# Patient Record
Sex: Male | Born: 1945 | Race: Black or African American | Hispanic: No | Marital: Married | Smoking: Former smoker
Health system: Southern US, Community
[De-identification: ages and names within clinical notes are randomized; demographics above are authoritative.]

## PROBLEM LIST (undated history)

## (undated) DIAGNOSIS — R7303 Prediabetes: Secondary | ICD-10-CM

## (undated) DIAGNOSIS — K635 Polyp of colon: Secondary | ICD-10-CM

## (undated) DIAGNOSIS — D649 Anemia, unspecified: Secondary | ICD-10-CM

## (undated) DIAGNOSIS — K621 Rectal polyp: Secondary | ICD-10-CM

## (undated) DIAGNOSIS — K219 Gastro-esophageal reflux disease without esophagitis: Secondary | ICD-10-CM

## (undated) DIAGNOSIS — I10 Essential (primary) hypertension: Secondary | ICD-10-CM

## (undated) HISTORY — PX: CHOLECYSTECTOMY: SHX55

---

## 1999-05-17 ENCOUNTER — Encounter (INDEPENDENT_AMBULATORY_CARE_PROVIDER_SITE_OTHER): Payer: Self-pay

## 1999-05-17 ENCOUNTER — Ambulatory Visit (HOSPITAL_COMMUNITY): Admission: RE | Admit: 1999-05-17 | Discharge: 1999-05-17 | Payer: Self-pay | Admitting: Gastroenterology

## 2001-03-07 ENCOUNTER — Encounter: Admission: RE | Admit: 2001-03-07 | Discharge: 2001-03-07 | Payer: Self-pay | Admitting: *Deleted

## 2001-03-07 ENCOUNTER — Encounter: Payer: Self-pay | Admitting: *Deleted

## 2001-07-27 ENCOUNTER — Encounter: Admission: RE | Admit: 2001-07-27 | Discharge: 2001-07-27 | Payer: Self-pay | Admitting: Family Medicine

## 2001-07-27 ENCOUNTER — Encounter: Payer: Self-pay | Admitting: Family Medicine

## 2001-08-02 ENCOUNTER — Encounter: Payer: Self-pay | Admitting: Family Medicine

## 2001-08-02 ENCOUNTER — Encounter: Admission: RE | Admit: 2001-08-02 | Discharge: 2001-08-02 | Payer: Self-pay | Admitting: Family Medicine

## 2001-10-23 ENCOUNTER — Ambulatory Visit (HOSPITAL_COMMUNITY): Admission: RE | Admit: 2001-10-23 | Discharge: 2001-10-23 | Payer: Self-pay | Admitting: Gastroenterology

## 2002-06-24 ENCOUNTER — Encounter: Admission: RE | Admit: 2002-06-24 | Discharge: 2002-06-24 | Payer: Self-pay | Admitting: Gastroenterology

## 2002-06-24 ENCOUNTER — Encounter: Payer: Self-pay | Admitting: Gastroenterology

## 2002-06-26 ENCOUNTER — Encounter: Payer: Self-pay | Admitting: Gastroenterology

## 2002-06-26 ENCOUNTER — Encounter: Admission: RE | Admit: 2002-06-26 | Discharge: 2002-06-26 | Payer: Self-pay | Admitting: Gastroenterology

## 2002-07-29 ENCOUNTER — Encounter: Payer: Self-pay | Admitting: General Surgery

## 2002-08-08 ENCOUNTER — Encounter: Payer: Self-pay | Admitting: General Surgery

## 2002-08-08 ENCOUNTER — Observation Stay (HOSPITAL_COMMUNITY): Admission: RE | Admit: 2002-08-08 | Discharge: 2002-08-09 | Payer: Self-pay | Admitting: General Surgery

## 2002-08-08 ENCOUNTER — Encounter (INDEPENDENT_AMBULATORY_CARE_PROVIDER_SITE_OTHER): Payer: Self-pay | Admitting: Specialist

## 2004-08-09 ENCOUNTER — Encounter: Admission: RE | Admit: 2004-08-09 | Discharge: 2004-08-09 | Payer: Self-pay | Admitting: Family Medicine

## 2006-07-26 ENCOUNTER — Encounter: Admission: RE | Admit: 2006-07-26 | Discharge: 2006-07-26 | Payer: Self-pay | Admitting: Family Medicine

## 2007-04-30 ENCOUNTER — Observation Stay (HOSPITAL_COMMUNITY): Admission: EM | Admit: 2007-04-30 | Discharge: 2007-04-30 | Payer: Self-pay | Admitting: Emergency Medicine

## 2008-06-18 ENCOUNTER — Emergency Department (HOSPITAL_COMMUNITY): Admission: EM | Admit: 2008-06-18 | Discharge: 2008-06-18 | Payer: Self-pay | Admitting: Family Medicine

## 2011-02-15 NOTE — H&P (Signed)
NAMEQADIR, FOLKS NO.:  [id number]   MEDICAL RECORD NO.:  [id number]          PATIENT TYPE:  INP   LOCATION:  4733                         FACILITY:  [facility]   PHYSICIAN:  Della Goo, M.D. DATE OF BIRTH:  1946/02/06   DATE OF ADMISSION:  04/30/2007  DATE OF DISCHARGE:                              HISTORY & PHYSICAL   PRIMARY CARE PHYSICIAN:  Dr. Rise Mu Mazzocchi   CHIEF COMPLAINT:  Increasing swelling of lips.   HISTORY OF PRESENT ILLNESS:  This is a 65 year old male presenting to  the emergency department secondary to complaints of worsening lip  swelling.  He reports the symptoms began at approximately 3 p.m.  He  denies having any difficulty breathing or tongue swelling.  The patient  has a history of hypertension and is on lisinopril therapy.  He denies  having any chest pain, nausea, vomiting, diarrhea, lightheadedness,  syncope.   PAST MEDICAL HISTORY:  1. Hypertension.  2. Type 2 diabetes mellitus which is diet controlled.  3. Hyperlipidemia.   MEDICATIONS:  1. Diltiazem 240 mg one p.o. daily.  2. Lisinopril 20 mg one p.o. daily.  3. Pravastatin 40 mg one p.o. daily.   PAST SURGICAL HISTORY:  Status post laparoscopic cholecystectomy.   ALLERGIES:  No previous allergies before this problem.   SOCIAL HISTORY:  Nonsmoker, nondrinker.   FAMILY HISTORY:  Noncontributory.   REVIEW OF SYSTEMS:  Pertinents are mentioned above.   PHYSICAL EXAMINATION FINDINGS:  This is a pleasant 65 year old well-  nourished, well-developed male in no acute distress.  VITAL SIGNS:  Temperature 97.9, blood pressure 144/95, heart rate 66,  respirations 20, O2 saturations 97%.  HEENT:  Normocephalic, atraumatic.  Pupils equally round, reactive to  light.  Extraocular muscles are intact.  Funduscopic benign.  There is  no scleral icterus.  Oropharynx clear.  Lips with 3+ edema.  NECK:  Supple, full range of motion.  No thyromegaly, adenopathy,  jugular venous  distension.  No tracheal deviation.  CARDIOVASCULAR:  Regular rate and rhythm.  No murmurs, gallops or rubs.  LUNGS:  Clear to auscultation bilaterally.  ABDOMEN:  Positive bowel sounds, soft, nontender, nondistended.  EXTREMITIES:  Without cyanosis, clubbing or edema.  NEUROLOGIC:  Alert and oriented x3.  No focal deficits.   LABORATORY STUDIES:  White blood cell count 9.8, hemoglobin 12.6,  hematocrit 37.7, MCV 88.3, platelets 220.  Sodium 137, potassium 3.8,  chloride 108, carbon dioxide 25, BUN 12, creatinine 1.22, glucose 105.   ASSESSMENT:  A 64 year old male being admitted with  1. Allergic reaction/angioedema.  2. Type 2 diabetes mellitus, diet controlled.  3. Hypertension.  4. Hyperlipidemia.  5. Anemia.   PLAN:  The patient will be admitted for observation and continued  therapy.  The patient has been administered Solu-Medrol and Benadryl  therapy.  The lisinopril therapy will be discontinued.  It is highly  suspect that this may be a reaction to the ACE inhibitor.  The patient  will continue on his pravastatin and diltiazem therapy.  The patient  will also be placed on a diabetic carbohydrate-modified diet and sliding  scale  insulin coverage for glucose checks with meals and at night.  DVT  prophylaxis has been ordered along with GI prophylaxis.      Della Goo, M.D.  Electronically Signed     HJ/MEDQ  D:  04/30/2007  T:  04/30/2007  Job:  [id number]   cc:   Talmadge Coventry, M.D.

## 2011-02-18 NOTE — Op Note (Signed)
NAME:  KIRKE, BREACH                        ACCOUNT NO.:  [id number]   MEDICAL RECORD NO.:  [id number]                   PATIENT TYPE:  AMB   LOCATION:  DAY                                  FACILITY:  [facility]   PHYSICIAN:  Adolph Pollack, M.D.            DATE OF BIRTH:  1946/06/21   DATE OF PROCEDURE:  08/08/2002  DATE OF DISCHARGE:                                 OPERATIVE REPORT   PREOPERATIVE DIAGNOSIS:  Biliary diskinesia.   POSTOPERATIVE DIAGNOSIS:  Biliary diskinesia.   PROCEDURE:  Laparoscopic cholecystectomy with intraoperative cholangiogram.   SURGEON:  Adolph Pollack, M.D.   ASSISTANT:  Anselm Pancoast. Zachery Dakins, M.D.   ANESTHESIA:  General.   INDICATIONS FOR PROCEDURE:  Mr. Robison is a 65 year old male with post  prandial epigastric pain radiating to the back. He does have known reflux  disease. An ultrasound did not demonstrate gallstones but a hepatobiliary  scan demonstrated a depressed ejection fraction of 20%. He now presents for  elective cholecystectomy. The procedure and the risks were explained to him  preoperatively.   TECHNIQUE:  He is brought to the operating room, placed supine on the  operating table and a general anesthetic was administered. The abdomen was  then partially shaved and sterilely prepped and draped. Dilute Marcaine was  infiltrated in the subumbilical region and a small subumbilical incision  made through the skin and subcutaneous tissue. The midline fascia was  identified and a 1 cm was made in the midline fascia. The peritoneal cavity  was entered bluntly and under direct vision. A pursestring suture of #0  Vicryl was placed around the fascial edges.  A Hasson trocar was introduced  into the peritoneal cavity and a pneumoperitoneum was created by  insufflation of CO2 gas.   Next, the laparoscope was introduced and he was placed in the reverse  Trendelenburg position with the right side partially rotated up. Under  direct  vision, an 11 mm trocar was placed through an epigastric incision and  two 5 mm trocars placed through right mid abdominal incisions. The fundus of  the gallbladder was grasped and retracted toward the right shoulder. There  was a lot of adipose tissue around the gallbladder and this was dissected  off it. I then was able to identify the infundibulum and mobilize this. This  allowed me to identify the cystic duct gallbladder junction, isolate this  and create a window around the cystic duct. I placed a clip above the cystic  duct gallbladder junction and made an incision at the cystic duct  gallbladder junction into the cystic duct. A cholangiocatheter was passed to  the anterior abdominal wall and placed into the cystic duct and then a  cholangiogram was performed.   Under real-time fluoroscopy, dilute contrast material was injected into the  cystic duct. The cystic duct was of moderate length. The common bile duct  promptly filled with contrast and then into  the duodenum without obvious  evidence of obstruction. The common hepatic and right and left hepatic ducts  also filled. Final reports pending the radiologist's interpretation.   The cholangiocatheter was removed, the cystic duct was then clipped three  times proximally and divided. The cystic artery was identified, a window  created around it.  It was then clipped and divided. The gallbladder was  dissected free from the liver bed with the cautery. Two small puncture  wounds were made in the gallbladder and a small amount of bile leaked out.  The gallbladder was then placed in an endopouch bag.   The gallbladder fossa was then irrigated and bleeding points were controlled  with the cautery. Approximately 1 1/2 to 2 liters of irrigation were used.  The area was inspected multiple times after this and no bleeding was noted.  No bile leakage was noted. The vast majority of the irrigation fluid was  then evacuated. The gallbladder was  removed through the subumbilical port  and the subumbilical fascial defect was closed by tightening up and tying  down the pursestring suture under laparoscopic vision. The remainder trocars  were then removed and the pneumoperitoneum was released. The skin incisions  were closed with 4-0 Monocryl subcuticular stitches. Steri-Strips and  sterile dressings were applied.   He tolerated the procedure well without any apparent complications and was  taken to the recovery room in satisfactory condition.                                               Adolph Pollack, M.D.    Kari Baars  D:  08/08/2002  T:  08/08/2002  Job:  [id number]   cc:   Anselmo Rod, M.D.  [address]  [place]  [place] [address]  Fax: [phone]   Talmadge Coventry, M.D.

## 2011-02-18 NOTE — Procedures (Signed)
[place]  Patient:    Joel Hall, Joel Hall Visit Number: [id number] MRN: [id number]          Service Type: END Location: ENDO Attending Physician:  Charna Elizabeth Dictated by:   Anselmo Rod, M.D. Proc. Date: 10/23/01 Admit Date:  10/23/2001   CC:         Talmadge Coventry, M.D.   Procedure Report  DATE OF BIRTH:  Jan 15, 1946.  PROCEDURE:  Colonoscopy.  ENDOSCOPIST:  Anselmo Rod, M.D.  INSTRUMENT USED:  Olympus video colonoscope.  INDICATION FOR PROCEDURE:  A 65 year old African-American male undergoing colonoscopy to rule out recurrent polyps.  The patient gives a previous history of colonic polyps removed in the past.  PREPROCEDURE PREPARATION:  Informed consent was procured from the patient. The patient was fasted for eight hours prior to the procedure and prepped with a bottle of magnesium citrate and a gallon of NuLytely the night prior to the procedure.  PREPROCEDURE PHYSICAL:  VITAL SIGNS:  The patient had stable vital signs.  NECK:  Supple.  CHEST:  Clear to auscultation.  S1, S2 regular.  ABDOMEN:  Soft with normal bowel sounds.  DESCRIPTION OF PROCEDURE:  The patient was placed in the left lateral decubitus position and sedated with 70 mg of Demerol and 7 mg of Versed intravenously.  Once the patient was adequately sedate and maintained on low-flow oxygen and continuous cardiac monitoring, the Olympus video colonoscope was advanced from the rectum to the cecum without difficulty. Except for a few early diverticula scattered throughout the colon, no other abnormalities were seen.  No masses, polyps, erosions, or ulcerations were present.  The entire colonic mucosa appeared healthy and without lesions. The procedure was complete to the cecum.  The ileocecal valve and appendiceal orifice were clearly visualized and photographed.  IMPRESSION:  A healthy-appearing colon except for a few early  scattered diverticula.  RECOMMENDATIONS: 1. Repeat colorectal cancer screening is recommended in the next five years    unless the patient were to develop any abnormal symptoms in the interim. 2. Considering his findings of early diverticular disease, a high-fiber diet    has been recommended. 3. Outpatient follow-up is advised on a p.r.n. basis. Dictated by:   Anselmo Rod, M.D. Attending Physician:  Charna Elizabeth DD:  10/23/01 TD:  10/24/01 Job: [id number]

## 2011-07-18 LAB — BASIC METABOLIC PANEL
BUN: 12
CO2: 25
Calcium: 8.7
Chloride: 108
Creatinine, Ser: 1.22
GFR calc Af Amer: >60
GFR calc non Af Amer: >60
Glucose, Bld: 105 — ABNORMAL HIGH
Potassium: 3.8
Sodium: 137

## 2011-07-18 LAB — CBC
HCT: 37.7 — ABNORMAL LOW
Hemoglobin: 12.6 — ABNORMAL LOW
MCHC: 33.3
MCV: 88.3
Platelets: 220
RBC: 4.27
RDW: 13.3
WBC: 9.8

## 2011-07-18 LAB — HEMOGLOBIN A1C: Hgb A1c MFr Bld: 6.1

## 2011-11-02 DIAGNOSIS — E039 Hypothyroidism, unspecified: Secondary | ICD-10-CM | POA: Diagnosis not present

## 2011-11-02 DIAGNOSIS — E119 Type 2 diabetes mellitus without complications: Secondary | ICD-10-CM | POA: Diagnosis not present

## 2011-11-02 DIAGNOSIS — I10 Essential (primary) hypertension: Secondary | ICD-10-CM | POA: Diagnosis not present

## 2011-11-02 DIAGNOSIS — E782 Mixed hyperlipidemia: Secondary | ICD-10-CM | POA: Diagnosis not present

## 2011-11-02 DIAGNOSIS — Z79899 Other long term (current) drug therapy: Secondary | ICD-10-CM | POA: Diagnosis not present

## 2012-01-03 DIAGNOSIS — E039 Hypothyroidism, unspecified: Secondary | ICD-10-CM | POA: Diagnosis not present

## 2012-01-03 DIAGNOSIS — D500 Iron deficiency anemia secondary to blood loss (chronic): Secondary | ICD-10-CM | POA: Diagnosis not present

## 2012-01-03 DIAGNOSIS — E782 Mixed hyperlipidemia: Secondary | ICD-10-CM | POA: Diagnosis not present

## 2012-01-03 DIAGNOSIS — Z6831 Body mass index (BMI) 31.0-31.9, adult: Secondary | ICD-10-CM | POA: Diagnosis not present

## 2012-01-03 DIAGNOSIS — E119 Type 2 diabetes mellitus without complications: Secondary | ICD-10-CM | POA: Diagnosis not present

## 2012-01-03 DIAGNOSIS — I10 Essential (primary) hypertension: Secondary | ICD-10-CM | POA: Diagnosis not present

## 2012-01-03 DIAGNOSIS — Z79899 Other long term (current) drug therapy: Secondary | ICD-10-CM | POA: Diagnosis not present

## 2012-04-18 DIAGNOSIS — E039 Hypothyroidism, unspecified: Secondary | ICD-10-CM | POA: Diagnosis not present

## 2012-04-18 DIAGNOSIS — E119 Type 2 diabetes mellitus without complications: Secondary | ICD-10-CM | POA: Diagnosis not present

## 2012-04-18 DIAGNOSIS — Z125 Encounter for screening for malignant neoplasm of prostate: Secondary | ICD-10-CM | POA: Diagnosis not present

## 2012-04-18 DIAGNOSIS — E782 Mixed hyperlipidemia: Secondary | ICD-10-CM | POA: Diagnosis not present

## 2012-04-18 DIAGNOSIS — M899 Disorder of bone, unspecified: Secondary | ICD-10-CM | POA: Diagnosis not present

## 2012-04-18 DIAGNOSIS — Z79899 Other long term (current) drug therapy: Secondary | ICD-10-CM | POA: Diagnosis not present

## 2012-05-03 DIAGNOSIS — I10 Essential (primary) hypertension: Secondary | ICD-10-CM | POA: Diagnosis not present

## 2012-05-03 DIAGNOSIS — R7309 Other abnormal glucose: Secondary | ICD-10-CM | POA: Diagnosis not present

## 2012-06-12 DIAGNOSIS — K648 Other hemorrhoids: Secondary | ICD-10-CM | POA: Diagnosis not present

## 2012-06-12 DIAGNOSIS — Z1211 Encounter for screening for malignant neoplasm of colon: Secondary | ICD-10-CM | POA: Diagnosis not present

## 2012-11-10 ENCOUNTER — Emergency Department (HOSPITAL_COMMUNITY)
Admission: EM | Admit: 2012-11-10 | Discharge: 2012-11-10 | Disposition: A | Discharge status: Home / Self Care | Payer: Medicare Other | Attending: Emergency Medicine | Admitting: Emergency Medicine

## 2012-11-10 ENCOUNTER — Encounter (HOSPITAL_COMMUNITY): Payer: Self-pay | Admitting: Emergency Medicine

## 2012-11-10 DIAGNOSIS — K625 Hemorrhage of anus and rectum: Secondary | ICD-10-CM | POA: Insufficient documentation

## 2012-11-10 DIAGNOSIS — Z8719 Personal history of other diseases of the digestive system: Secondary | ICD-10-CM | POA: Insufficient documentation

## 2012-11-10 DIAGNOSIS — N289 Disorder of kidney and ureter, unspecified: Secondary | ICD-10-CM | POA: Diagnosis not present

## 2012-11-10 DIAGNOSIS — R109 Unspecified abdominal pain: Secondary | ICD-10-CM | POA: Insufficient documentation

## 2012-11-10 DIAGNOSIS — I10 Essential (primary) hypertension: Secondary | ICD-10-CM | POA: Diagnosis not present

## 2012-11-10 DIAGNOSIS — Z87891 Personal history of nicotine dependence: Secondary | ICD-10-CM | POA: Insufficient documentation

## 2012-11-10 DIAGNOSIS — R197 Diarrhea, unspecified: Secondary | ICD-10-CM | POA: Insufficient documentation

## 2012-11-10 DIAGNOSIS — Z862 Personal history of diseases of the blood and blood-forming organs and certain disorders involving the immune mechanism: Secondary | ICD-10-CM | POA: Diagnosis not present

## 2012-11-10 DIAGNOSIS — Z860100 Personal history of colon polyps, unspecified: Secondary | ICD-10-CM | POA: Insufficient documentation

## 2012-11-10 DIAGNOSIS — K921 Melena: Secondary | ICD-10-CM | POA: Insufficient documentation

## 2012-11-10 DIAGNOSIS — K648 Other hemorrhoids: Secondary | ICD-10-CM | POA: Insufficient documentation

## 2012-11-10 DIAGNOSIS — Z79899 Other long term (current) drug therapy: Secondary | ICD-10-CM | POA: Diagnosis not present

## 2012-11-10 DIAGNOSIS — R209 Unspecified disturbances of skin sensation: Secondary | ICD-10-CM | POA: Diagnosis not present

## 2012-11-10 HISTORY — DX: Prediabetes: R73.03

## 2012-11-10 HISTORY — DX: Essential (primary) hypertension: I10

## 2012-11-10 HISTORY — DX: Anemia, unspecified: D64.9

## 2012-11-10 HISTORY — DX: Gastro-esophageal reflux disease without esophagitis: K21.9

## 2012-11-10 HISTORY — DX: Rectal polyp: K62.1

## 2012-11-10 HISTORY — DX: Polyp of colon: K63.5

## 2012-11-10 LAB — CBC
MCH: 29 pg (ref 26.0–34.0)
MCHC: 33.4 g/dL (ref 30.0–36.0)
MCV: 86.8 fL (ref 78.0–100.0)
Platelets: 233 10*3/uL (ref 150–400)

## 2012-11-10 LAB — COMPREHENSIVE METABOLIC PANEL
ALT: 15 U/L (ref 0–53)
CO2: 27 mEq/L (ref 19–32)
Calcium: 9.1 mg/dL (ref 8.4–10.5)
Creatinine, Ser: 1.39 mg/dL — ABNORMAL HIGH (ref 0.50–1.35)
GFR calc Af Amer: 59 mL/min — ABNORMAL LOW (ref >90)
GFR calc non Af Amer: 51 mL/min — ABNORMAL LOW (ref >90)
Glucose, Bld: 67 mg/dL — ABNORMAL LOW (ref 70–99)
Sodium: 135 mEq/L (ref 135–145)
Total Protein: 7.2 g/dL (ref 6.0–8.3)

## 2012-11-10 NOTE — ED Notes (Signed)
Pt had an episode of red "not quite as bright as it has been" stool.

## 2012-11-10 NOTE — ED Provider Notes (Signed)
History     CSN: [id number]  Arrival date & time 11/10/12  1243   First MD Initiated Contact with Patient 11/10/12 1305      Chief Complaint  Patient presents with  . Rectal Bleeding  . Numbness    (Consider location/radiation/quality/duration/timing/severity/associated sxs/prior treatment) HPI Patient had rectal bleeding today. Hehad 2watery bowel movements which were grossly bloody. He denies abdominal pain denies chest pain denies pain anywhere 8 well this morning. Denies lightheadedness with standing no treatment prior to coming here nothing makes symptoms better or worse. He is presently asymptomatic Past Medical History  Diagnosis Date  . Anemia   . Prediabetes   . GERD (gastroesophageal reflux disease)   . Hypertension   . Colorectal polyps     Past Surgical History  Procedure Laterality Date  . Cholecystectomy      No family history on file.  History  Substance Use Topics  . Smoking status: Former Smoker    Types: Cigarettes    Quit date: 10/03/1978  . Smokeless tobacco: Never Used  . Alcohol Use: No      Review of Systems  Constitutional: Negative.   HENT: Negative.   Respiratory: Negative.   Cardiovascular: Negative.   Gastrointestinal: Positive for blood in stool.  Musculoskeletal: Negative.   Skin: Negative.   Neurological: Positive for numbness.       Numbness in both arms for several months, unchanged  Psychiatric/Behavioral: Negative.   All other systems reviewed and are negative.    Allergies  Lisinopril  Home Medications   Current Outpatient Rx  Name  Route  Sig  Dispense  Refill  . carvedilol (COREG) 25 MG tablet   Oral   Take 25 mg by mouth daily before breakfast.         . hydrochlorothiazide (HYDRODIURIL) 25 MG tablet   Oral   Take 12.5 mg by mouth daily before breakfast.           BP 154/90  Pulse 75  Temp(Src) 97.7 F (36.5 C) (Oral)  Resp 18  SpO2 99%  Physical Exam  Nursing note and vitals  reviewed. Constitutional: He is oriented to person, place, and time. He appears well-developed and well-nourished.  HENT:  Head: Normocephalic and atraumatic.  Eyes: Conjunctivae are normal. Pupils are equal, round, and reactive to light.  Neck: Neck supple. No tracheal deviation present. No thyromegaly present.  Cardiovascular: Normal rate and regular rhythm.   No murmur heard. Pulmonary/Chest: Effort normal and breath sounds normal.  Abdominal: Soft. Bowel sounds are normal. He exhibits no distension. There is no tenderness.  Genitourinary: Prostate normal and penis normal. Guaiac positive stool.  Rectal normal tone gross blood per rectum no tenderness  Musculoskeletal: Normal range of motion. He exhibits no edema and no tenderness.  Neurological: He is alert and oriented to person, place, and time. Coordination normal.  Not lightheaded on standing  Skin: Skin is warm and dry. No rash noted.  Psychiatric: He has a normal mood and affect.    ED Course  Procedures (including critical care time)  Labs Reviewed  CBC  COMPREHENSIVE METABOLIC PANEL   No results found. Results for orders placed during the hospital encounter of 11/10/12  CBC      Result Value Range   WBC 6.9  4.0 - 10.5 K/uL   RBC 4.31  4.22 - 5.81 MIL/uL   Hemoglobin 12.5 (*) 13.0 - 17.0 g/dL   HCT 11.9 (*) 14.7 - 82.9 %   MCV 86.8  78.0 - 100.0 fL   MCH 29.0  26.0 - 34.0 pg   MCHC 33.4  30.0 - 36.0 g/dL   RDW 16.1  09.6 - 04.5 %   Platelets 233  150 - 400 K/uL  COMPREHENSIVE METABOLIC PANEL      Result Value Range   Sodium 135  135 - 145 mEq/L   Potassium 3.8  3.5 - 5.1 mEq/L   Chloride 100  96 - 112 mEq/L   CO2 27  19 - 32 mEq/L   Glucose, Bld 67 (*) 70 - 99 mg/dL   BUN 17  6 - 23 mg/dL   Creatinine, Ser 4.09 (*) 0.50 - 1.35 mg/dL   Calcium 9.1  8.4 - 81.1 mg/dL   Total Protein 7.2  6.0 - 8.3 g/dL   Albumin 3.8  3.5 - 5.2 g/dL   AST 18  0 - 37 U/L   ALT 15  0 - 53 U/L   Alkaline Phosphatase 89  39 -  117 U/L   Total Bilirubin 0.3  0.3 - 1.2 mg/dL   GFR calc non Af Amer 51 (*) >90 mL/min   GFR calc Af Amer 59 (*) >90 mL/min  OCCULT BLOOD, POC DEVICE      Result Value Range   Fecal Occult Bld POSITIVE (*) NEGATIVE   No results found.   No diagnosis found.  3:20 PM patient continues to feel well. He reports to me that he's been told he has internal hemorrhoids  MDM  Patient's hemoglobin is stable from 2008. Serum creatinine slightly worse Case discussed with Dr. Greggory Brandy . Plan avoid aspirin and NSAIDs Patient to call office of [facility] in 2 days for followup. He is also instructed to call his primary care physician at [facility] to compare today's serum creatinine to his most recent taken at the office Diagnosis #1 rectal bleeding #2 renal insufficiency          Doug Sou, MD 11/10/12 1529

## 2012-11-10 NOTE — ED Notes (Addendum)
Pt c/o blood in his stool. Pt reports that today he experienced some abdominal cramping followed by a loose stool that was bright red. Pt reports having a colonoscopy two months ago, which the pt reports being clear. Pt reports a history of being anemic. Pt also reports numbness and tingling in his hands and feet at night while sleeping.

## 2012-11-14 DIAGNOSIS — K921 Melena: Secondary | ICD-10-CM | POA: Diagnosis not present

## 2012-12-23 ENCOUNTER — Emergency Department (INDEPENDENT_AMBULATORY_CARE_PROVIDER_SITE_OTHER)
Admission: EM | Admit: 2012-12-23 | Discharge: 2012-12-23 | Disposition: A | Discharge status: Home / Self Care | Payer: Medicare Other | Source: Home / Self Care | Attending: Emergency Medicine | Admitting: Emergency Medicine

## 2012-12-23 ENCOUNTER — Encounter (HOSPITAL_COMMUNITY): Payer: Self-pay | Admitting: *Deleted

## 2012-12-23 DIAGNOSIS — I10 Essential (primary) hypertension: Secondary | ICD-10-CM

## 2012-12-23 DIAGNOSIS — Z760 Encounter for issue of repeat prescription: Secondary | ICD-10-CM

## 2012-12-23 MED ORDER — HYDROCHLOROTHIAZIDE 25 MG PO TABS: 25.0000 mg | ORAL_TABLET | Freq: Every day | ORAL | Status: AC

## 2012-12-23 MED ORDER — CARVEDILOL 6.25 MG PO TABS: 6.2500 mg | ORAL_TABLET | Freq: Every day | ORAL | Status: DC

## 2012-12-23 NOTE — ED Notes (Signed)
Pt  Changed  pcp  He  Ran out  Of  His  bp  meds    He  Has  An  appt  At  [facility]    In  2  Days    He  Had  A  Headache  Last  Pm  And  Reports    Being  Slightly   Dizzy          At  This  Time  He  Is  Sitting  Upright on  Exam table    Speaking in  Complete         sentances             He  Ambulated  With a  Steady  Fluid   Gait

## 2012-12-23 NOTE — ED Provider Notes (Addendum)
History     CSN: [id number]  Arrival date & time 12/23/12  1440   First MD Initiated Contact with Patient 12/23/12 1505      Chief Complaint  Patient presents with  . Medication Refill    (Consider location/radiation/quality/duration/timing/severity/associated sxs/prior treatment) HPI Comments: Patient process urgent care this afternoon described he ran out of his blood pressure medicines and has been today's since he has not taken his Coreg. He's been having some slight dizziness recurrently and having a mild headache. At this point patient denies any chest pains feeling dizzy denies any neurological symptoms including headache at this moment. He attempted to obtain a refill of his Coreg through his primary care doctor's office but ran out of it before he could get it. Have taken his blood pressure at home and have been seen levels of 160/100 and is wondering if his medicines need to be readjusted.  Patient is a 67 y.o. male presenting with headaches. The history is provided by the patient.  Headache Pain location:  Generalized Quality:  Sharp Radiates to:  Does not radiate Severity currently:  6/10 Severity at highest:  6/10 Onset quality:  Gradual Timing:  Intermittent Progression:  Resolved Chronicity:  Recurrent Context: activity   Relieved by:  Nothing Worsened by:  Nothing tried Ineffective treatments:  None tried Associated symptoms: dizziness   Associated symptoms: no abdominal pain, no pain, no fatigue, no fever, no focal weakness, no hearing loss, no nausea, no near-syncope, no neck pain, no neck stiffness, no numbness, no photophobia, no seizures, no sinus pressure, no visual change, no vomiting and no weakness     Past Medical History  Diagnosis Date  . Anemia   . Prediabetes   . GERD (gastroesophageal reflux disease)   . Hypertension   . Colorectal polyps     Past Surgical History  Procedure Laterality Date  . Cholecystectomy      History reviewed. No  pertinent family history.  History  Substance Use Topics  . Smoking status: Former Smoker    Types: Cigarettes    Quit date: 10/03/1978  . Smokeless tobacco: Never Used  . Alcohol Use: No      Review of Systems  Constitutional: Negative for fever, diaphoresis, activity change, appetite change and fatigue.  HENT: Negative for hearing loss, neck pain, neck stiffness and sinus pressure.   Eyes: Negative for photophobia and pain.  Respiratory: Negative for shortness of breath.   Cardiovascular: Negative for chest pain, palpitations, leg swelling and near-syncope.  Gastrointestinal: Negative for nausea, vomiting and abdominal pain.  Endocrine: Negative for polyuria.  Neurological: Positive for dizziness and headaches. Negative for focal weakness, seizures, speech difficulty, weakness, light-headedness and numbness.    Allergies  Lisinopril  Home Medications   Current Outpatient Rx  Name  Route  Sig  Dispense  Refill  . carvedilol (COREG) 6.25 MG tablet   Oral   Take 1 tablet (6.25 mg total) by mouth daily.   30 tablet   0   . carvedilol (COREG) 6.25 MG tablet   Oral   Take 1 tablet (6.25 mg total) by mouth daily before breakfast.   30 tablet   0   . cetirizine (ZYRTEC) 10 MG tablet   Oral   Take 10 mg by mouth daily.         . cholecalciferol (VITAMIN D) 1000 UNITS tablet   Oral   Take 1,000 Units by mouth daily.         . hydrochlorothiazide (HYDRODIURIL)  25 MG tablet   Oral   Take 12.5 mg by mouth daily before breakfast.         . hydrochlorothiazide (HYDRODIURIL) 25 MG tablet   Oral   Take 1 tablet (25 mg total) by mouth daily.   30 tablet   0   . Magnesium 250 MG TABS   Oral   Take 1 tablet by mouth daily.         . naproxen sodium (ANAPROX) 220 MG tablet   Oral   Take 220 mg by mouth 2 (two) times daily with a meal.         . Potassium Gluconate 595 MG CAPS   Oral   Take 1 capsule by mouth daily.           BP 155/90  Pulse 75   Temp(Src) 98.4 F (36.9 C) (Oral)  Resp 16  SpO2 100%  Physical Exam  Nursing note and vitals reviewed. Constitutional: Vital signs are normal. He appears well-developed.  Non-toxic appearance. He does not have a sickly appearance. He does not appear ill. No distress.  HENT:  Head: Normocephalic.  Eyes: Pupils are equal, round, and reactive to light.  Neck: Normal range of motion. Neck supple. No JVD present.  Cardiovascular: Normal rate, regular rhythm and intact distal pulses.  Exam reveals no gallop, no distant heart sounds and no friction rub.   No murmur heard. With handheld device obtained a rhythm strip of 60 seconds were no abnormalities were seen normal sinus rhythm and with a ventricular rate of approximately 80 beats per minute. No obvious ST or T wave abnormalities.  Pulmonary/Chest: Effort normal and breath sounds normal.  Neurological: He is alert. No cranial nerve deficit. Coordination normal.  Skin: Skin is warm. No rash noted. No erythema.    ED Course  Procedures (including critical care time)  Labs Reviewed - No data to display No results found.   1. Hypertension   2. Encounter for medication refill       MDM  Hypertension request for medicine refills. Brief focus cardiovascular neurological exam unremarkable. Have instructed patient to followup with his primary care Dr. for future refills and followup his blood pressure in the next 2-3 weeks. We have also discussed what symptoms should warrant medical attention in the emergency department patient agrees with resuming his blood pressure medicines current treatment and recommendation for followup.        Jimmie Molly, MD 12/23/12 1610   Jimmie Molly, MD 12/23/12 [id number]  Jimmie Molly, MD 12/23/12 [phone]

## 2012-12-25 DIAGNOSIS — H9190 Unspecified hearing loss, unspecified ear: Secondary | ICD-10-CM | POA: Diagnosis not present

## 2012-12-25 DIAGNOSIS — Z23 Encounter for immunization: Secondary | ICD-10-CM | POA: Diagnosis not present

## 2012-12-25 DIAGNOSIS — E119 Type 2 diabetes mellitus without complications: Secondary | ICD-10-CM | POA: Diagnosis not present

## 2012-12-25 DIAGNOSIS — Z0000 Encounter for general adult medical examination without abnormal findings: Secondary | ICD-10-CM | POA: Diagnosis not present

## 2012-12-25 DIAGNOSIS — I10 Essential (primary) hypertension: Secondary | ICD-10-CM | POA: Diagnosis not present

## 2012-12-25 DIAGNOSIS — E785 Hyperlipidemia, unspecified: Secondary | ICD-10-CM | POA: Diagnosis not present

## 2012-12-25 DIAGNOSIS — Z125 Encounter for screening for malignant neoplasm of prostate: Secondary | ICD-10-CM | POA: Diagnosis not present

## 2012-12-25 DIAGNOSIS — K1370 Unspecified lesions of oral mucosa: Secondary | ICD-10-CM | POA: Diagnosis not present

## 2012-12-25 DIAGNOSIS — M79609 Pain in unspecified limb: Secondary | ICD-10-CM | POA: Diagnosis not present

## 2012-12-25 DIAGNOSIS — M5412 Radiculopathy, cervical region: Secondary | ICD-10-CM | POA: Diagnosis not present

## 2012-12-26 ENCOUNTER — Other Ambulatory Visit: Payer: Self-pay | Admitting: Family Medicine

## 2012-12-26 ENCOUNTER — Ambulatory Visit
Admission: RE | Admit: 2012-12-26 | Discharge: 2012-12-26 | Disposition: A | Discharge status: Home / Self Care | Payer: Medicare Other | Source: Ambulatory Visit | Attending: Family Medicine | Admitting: Family Medicine

## 2012-12-26 DIAGNOSIS — M5412 Radiculopathy, cervical region: Secondary | ICD-10-CM

## 2012-12-26 DIAGNOSIS — M7730 Calcaneal spur, unspecified foot: Secondary | ICD-10-CM | POA: Diagnosis not present

## 2012-12-26 DIAGNOSIS — M79609 Pain in unspecified limb: Secondary | ICD-10-CM

## 2012-12-26 DIAGNOSIS — M47812 Spondylosis without myelopathy or radiculopathy, cervical region: Secondary | ICD-10-CM | POA: Diagnosis not present

## 2012-12-27 DIAGNOSIS — H35349 Macular cyst, hole, or pseudohole, unspecified eye: Secondary | ICD-10-CM | POA: Diagnosis not present

## 2012-12-28 DIAGNOSIS — H35349 Macular cyst, hole, or pseudohole, unspecified eye: Secondary | ICD-10-CM | POA: Diagnosis not present

## 2012-12-28 DIAGNOSIS — H43819 Vitreous degeneration, unspecified eye: Secondary | ICD-10-CM | POA: Diagnosis not present

## 2012-12-28 DIAGNOSIS — H2510 Age-related nuclear cataract, unspecified eye: Secondary | ICD-10-CM | POA: Diagnosis not present

## 2012-12-31 DIAGNOSIS — H35349 Macular cyst, hole, or pseudohole, unspecified eye: Secondary | ICD-10-CM | POA: Diagnosis not present

## 2013-01-01 DIAGNOSIS — H35349 Macular cyst, hole, or pseudohole, unspecified eye: Secondary | ICD-10-CM | POA: Diagnosis not present

## 2013-01-11 DIAGNOSIS — H9319 Tinnitus, unspecified ear: Secondary | ICD-10-CM | POA: Diagnosis not present

## 2013-01-29 DIAGNOSIS — H35349 Macular cyst, hole, or pseudohole, unspecified eye: Secondary | ICD-10-CM | POA: Diagnosis not present

## 2013-03-15 DIAGNOSIS — E785 Hyperlipidemia, unspecified: Secondary | ICD-10-CM | POA: Diagnosis not present

## 2013-08-28 ENCOUNTER — Encounter: Payer: Self-pay | Admitting: Podiatry

## 2013-08-28 ENCOUNTER — Ambulatory Visit (INDEPENDENT_AMBULATORY_CARE_PROVIDER_SITE_OTHER): Payer: Medicare Other | Admitting: Podiatry

## 2013-08-28 VITALS — BP 125/74 | HR 83 | Ht 65.0 in | Wt 207.0 lb

## 2013-08-28 DIAGNOSIS — M778 Other enthesopathies, not elsewhere classified: Secondary | ICD-10-CM

## 2013-08-28 DIAGNOSIS — M21969 Unspecified acquired deformity of unspecified lower leg: Secondary | ICD-10-CM | POA: Diagnosis not present

## 2013-08-28 DIAGNOSIS — M7750 Other enthesopathy of unspecified foot: Secondary | ICD-10-CM | POA: Diagnosis not present

## 2013-08-28 MED ORDER — NABUMETONE 500 MG PO TABS: 500.0000 mg | ORAL_TABLET | Freq: Two times a day (BID) | ORAL | Status: AC

## 2013-08-28 NOTE — Patient Instructions (Signed)
Seen for right foot pain. There is swelling in 5th MPJ area right foot. May benefit from custom made orthotics.  We get the orthotics prepared on next visit.

## 2013-08-28 NOTE — Progress Notes (Signed)
Patient was in a year ago for right foot pain at the first and 5th MPJ area and sometimes on ankle anterior area.  Today pain is at lateral lesser MPJ area and on top of the bunion area right foot for the past 3-4 weeks. Still working on feet.   Objective: Elevated first ray with hypermobility.  Positive of swollen 5th MPJ right.  Assessment: Capsulitis 5th MPJ right. Forefoot varus bilateral. Hypermobile first ray bilateral. Elevated first ray bilateral.  Plan: Reviewed clinical findings and available treatment options. Rx. Relafen. May benefit from Orthotic shoe inserts. Cotton osteotomy procedure reviewed.

## 2013-10-15 DIAGNOSIS — E119 Type 2 diabetes mellitus without complications: Secondary | ICD-10-CM | POA: Diagnosis not present

## 2013-10-15 DIAGNOSIS — E785 Hyperlipidemia, unspecified: Secondary | ICD-10-CM | POA: Diagnosis not present

## 2013-10-15 DIAGNOSIS — I10 Essential (primary) hypertension: Secondary | ICD-10-CM | POA: Diagnosis not present

## 2013-10-15 DIAGNOSIS — Z23 Encounter for immunization: Secondary | ICD-10-CM | POA: Diagnosis not present

## 2014-04-15 ENCOUNTER — Other Ambulatory Visit: Payer: Self-pay | Admitting: Family Medicine

## 2014-04-15 DIAGNOSIS — Z1159 Encounter for screening for other viral diseases: Secondary | ICD-10-CM | POA: Diagnosis not present

## 2014-04-15 DIAGNOSIS — I10 Essential (primary) hypertension: Secondary | ICD-10-CM | POA: Diagnosis not present

## 2014-04-15 DIAGNOSIS — Z0000 Encounter for general adult medical examination without abnormal findings: Secondary | ICD-10-CM | POA: Diagnosis not present

## 2014-04-15 DIAGNOSIS — Z125 Encounter for screening for malignant neoplasm of prostate: Secondary | ICD-10-CM | POA: Diagnosis not present

## 2014-04-15 DIAGNOSIS — H9319 Tinnitus, unspecified ear: Secondary | ICD-10-CM | POA: Diagnosis not present

## 2014-04-15 DIAGNOSIS — M109 Gout, unspecified: Secondary | ICD-10-CM | POA: Diagnosis not present

## 2014-04-15 DIAGNOSIS — H93A3 Pulsatile tinnitus, bilateral: Secondary | ICD-10-CM

## 2014-04-15 DIAGNOSIS — Z23 Encounter for immunization: Secondary | ICD-10-CM | POA: Diagnosis not present

## 2014-04-15 DIAGNOSIS — M7020 Olecranon bursitis, unspecified elbow: Secondary | ICD-10-CM | POA: Diagnosis not present

## 2014-04-15 DIAGNOSIS — E785 Hyperlipidemia, unspecified: Secondary | ICD-10-CM | POA: Diagnosis not present

## 2014-04-15 DIAGNOSIS — E119 Type 2 diabetes mellitus without complications: Secondary | ICD-10-CM | POA: Diagnosis not present

## 2014-04-29 ENCOUNTER — Ambulatory Visit
Admission: RE | Admit: 2014-04-29 | Discharge: 2014-04-29 | Disposition: A | Discharge status: Home / Self Care | Payer: Medicare Other | Source: Ambulatory Visit | Attending: Family Medicine | Admitting: Family Medicine

## 2014-04-29 DIAGNOSIS — H93A3 Pulsatile tinnitus, bilateral: Secondary | ICD-10-CM

## 2014-04-29 DIAGNOSIS — I658 Occlusion and stenosis of other precerebral arteries: Secondary | ICD-10-CM | POA: Diagnosis not present

## 2014-05-08 ENCOUNTER — Other Ambulatory Visit: Payer: Self-pay | Admitting: Family Medicine

## 2014-05-08 DIAGNOSIS — R9439 Abnormal result of other cardiovascular function study: Secondary | ICD-10-CM

## 2014-05-09 ENCOUNTER — Ambulatory Visit
Admission: RE | Admit: 2014-05-09 | Discharge: 2014-05-09 | Disposition: A | Discharge status: Home / Self Care | Payer: Medicare Other | Source: Ambulatory Visit | Attending: Family Medicine | Admitting: Family Medicine

## 2014-05-09 DIAGNOSIS — R9439 Abnormal result of other cardiovascular function study: Secondary | ICD-10-CM

## 2014-05-09 DIAGNOSIS — E042 Nontoxic multinodular goiter: Secondary | ICD-10-CM | POA: Diagnosis not present

## 2014-05-27 ENCOUNTER — Other Ambulatory Visit: Payer: Self-pay | Admitting: Family Medicine

## 2014-05-27 DIAGNOSIS — E041 Nontoxic single thyroid nodule: Secondary | ICD-10-CM

## 2014-06-05 ENCOUNTER — Encounter (INDEPENDENT_AMBULATORY_CARE_PROVIDER_SITE_OTHER): Payer: Self-pay

## 2014-06-05 ENCOUNTER — Ambulatory Visit
Admission: RE | Admit: 2014-06-05 | Discharge: 2014-06-05 | Disposition: A | Discharge status: Home / Self Care | Payer: Medicare Other | Source: Ambulatory Visit | Attending: Family Medicine | Admitting: Family Medicine

## 2014-06-05 ENCOUNTER — Other Ambulatory Visit (HOSPITAL_COMMUNITY)
Admission: RE | Admit: 2014-06-05 | Discharge: 2014-06-05 | Disposition: A | Discharge status: Home / Self Care | Payer: Medicare Other | Source: Ambulatory Visit | Attending: Interventional Radiology | Admitting: Interventional Radiology

## 2014-06-05 DIAGNOSIS — E041 Nontoxic single thyroid nodule: Secondary | ICD-10-CM | POA: Diagnosis not present

## 2014-11-17 ENCOUNTER — Encounter (HOSPITAL_COMMUNITY): Payer: Self-pay

## 2014-11-17 ENCOUNTER — Emergency Department (INDEPENDENT_AMBULATORY_CARE_PROVIDER_SITE_OTHER)
Admission: EM | Admit: 2014-11-17 | Discharge: 2014-11-17 | Disposition: A | Discharge status: Home / Self Care | Payer: Medicare Other | Source: Home / Self Care | Attending: Emergency Medicine | Admitting: Emergency Medicine

## 2014-11-17 ENCOUNTER — Emergency Department (INDEPENDENT_AMBULATORY_CARE_PROVIDER_SITE_OTHER): Payer: Medicare Other

## 2014-11-17 DIAGNOSIS — M25572 Pain in left ankle and joints of left foot: Secondary | ICD-10-CM | POA: Diagnosis not present

## 2014-11-17 DIAGNOSIS — S93402A Sprain of unspecified ligament of left ankle, initial encounter: Secondary | ICD-10-CM

## 2014-11-17 MED ORDER — MELOXICAM 7.5 MG PO TABS: 7.5000 mg | ORAL_TABLET | Freq: Two times a day (BID) | ORAL | Status: AC

## 2014-11-17 NOTE — ED Provider Notes (Signed)
CSN: [id number]     Arrival date & time 11/17/14  1250 History   First MD Initiated Contact with Patient 11/17/14 1325     Chief Complaint  Patient presents with  . Ankle Pain   (Consider location/radiation/quality/duration/timing/severity/associated sxs/prior Treatment) Patient is a 69 y.o. male presenting with ankle pain. The history is provided by the patient.  Ankle Pain Location:  Ankle Time since incident:  1 week Injury: no   Ankle location:  L ankle Pain details:    Quality:  Aching and sharp   Radiates to:  Does not radiate   Severity:  Mild   Onset quality:  Gradual Chronicity:  New Dislocation: no   Prior injury to area:  No Ineffective treatments:  Arthritis medication Associated symptoms: stiffness   Associated symptoms: no back pain, no decreased ROM, no numbness and no swelling     Past Medical History  Diagnosis Date  . Anemia   . Prediabetes   . GERD (gastroesophageal reflux disease)   . Hypertension   . Colorectal polyps    Past Surgical History  Procedure Laterality Date  . Cholecystectomy     No family history on file. History  Substance Use Topics  . Smoking status: Former Smoker    Types: Cigarettes    Quit date: 10/03/1978  . Smokeless tobacco: Never Used  . Alcohol Use: No    Review of Systems  Constitutional: Negative.   Musculoskeletal: Positive for stiffness. Negative for myalgias, back pain, joint swelling and gait problem.  Skin: Negative.     Allergies  Lisinopril  Home Medications   Prior to Admission medications   Medication Sig Start Date End Date Taking? Authorizing Provider  amLODipine (NORVASC) 5 MG tablet Take 5 mg by mouth daily.    Historical Provider, MD  carvedilol (COREG) 6.25 MG tablet Take 1 tablet (6.25 mg total) by mouth daily. 12/23/12   Jimmie MollyPaolo Coll, MD  cholecalciferol (VITAMIN D) 1000 UNITS tablet Take 5,000 Units by mouth daily.     Historical Provider, MD  hydrochlorothiazide (HYDRODIURIL) 25 MG tablet  Take 1 tablet (25 mg total) by mouth daily. 12/23/12   Jimmie MollyPaolo Coll, MD  Magnesium 250 MG TABS Take 1 tablet by mouth daily.    Historical Provider, MD  meloxicam (MOBIC) 7.5 MG tablet Take 1 tablet (7.5 mg total) by mouth 2 (two) times daily after a meal. 11/17/14   Linna HoffJames D Kindl, MD  nabumetone (RELAFEN) 500 MG tablet Take 1 tablet (500 mg total) by mouth 2 (two) times daily. 08/28/13   Myeong Sheard, DPM  Potassium Gluconate 595 MG CAPS Take 1 capsule by mouth daily.    Historical Provider, MD  pravastatin (PRAVACHOL) 20 MG tablet Take 20 mg by mouth daily.    Historical Provider, MD   BP 134/95 mmHg  Pulse 66  Temp(Src) 97.9 F (36.6 C) (Oral)  Resp 16  Ht 5\' 5"  (1.651 m)  Wt 199 lb (90.266 kg)  BMI 33.12 kg/m2  SpO2 99% Physical Exam  Constitutional: He is oriented to person, place, and time. He appears well-developed and well-nourished.  Musculoskeletal: He exhibits tenderness. He exhibits no edema.       Left ankle: He exhibits normal range of motion, no swelling and normal pulse. No head of 5th metatarsal and no proximal fibula tenderness found. Achilles tendon normal.  Neurological: He is alert and oriented to person, place, and time.  Skin: Skin is warm and dry.  Nursing note and vitals reviewed.   ED  Course  Procedures (including critical care time) Labs Review Labs Reviewed - No data to display  Imaging Review Dg Ankle Complete Left  11/17/2014   CLINICAL DATA:  Anterior medial ankle pain.  EXAM: LEFT ANKLE COMPLETE - 3+ VIEW  COMPARISON:  None.  FINDINGS: There is no evidence of fracture, dislocation, or joint effusion. The ankle mortise is intact there is no arthropathy. There is a sclerotic serpiginous elongated abnormality in the distal tibial diametaphysis without endosteal scalloping, soft tissue mass, bone destruction or periosteal reaction, likely reflecting a benign chondroid lesion. There is a small plantar calcaneal spur. Soft tissues are unremarkable.  IMPRESSION:  No acute osseous injury of the left ankle.   Electronically Signed   By: Elige Ko   On: 11/17/2014 15:36     MDM   1. Ankle sprain, left, initial encounter        Linna Hoff, MD 11/17/14 1558

## 2014-11-17 NOTE — ED Notes (Signed)
Patient complains of pain to the bottom of his left foot that radiates across his left ankle Was doing some work in the crawl space but denies any injury Patient also complains of having some pain to his right elbow

## 2015-01-04 IMAGING — US US SOFT TISSUE HEAD/NECK
1 series · 13 of 25 positions shown · non-contrast
Comparison: 04/29/2014

CLINICAL DATA: Thyroid nodules demonstrated on recent carotid
ultrasound

EXAM:
THYROID ULTRASOUND
TECHNIQUE: Ultrasound examination of the thyroid gland and adjacent soft
tissues was performed.

[Series 1: us soft tissue head/neck · 0.06mm/px · 13 of 64 slices shown]
[im 1/64]
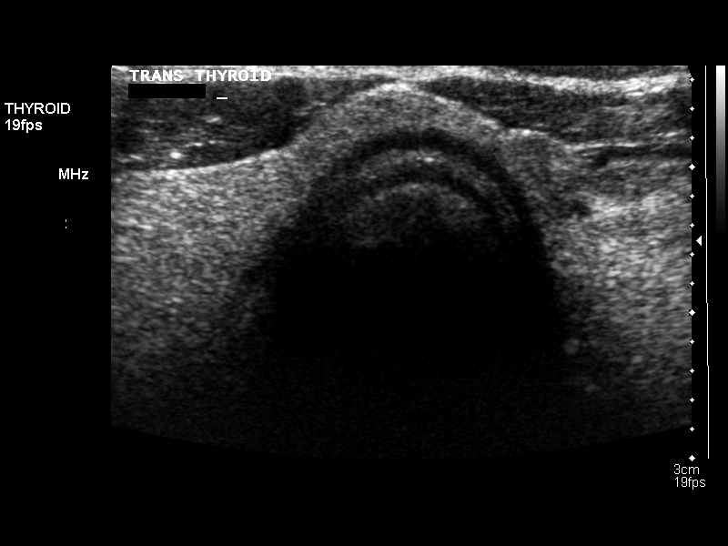
[im 6/64]
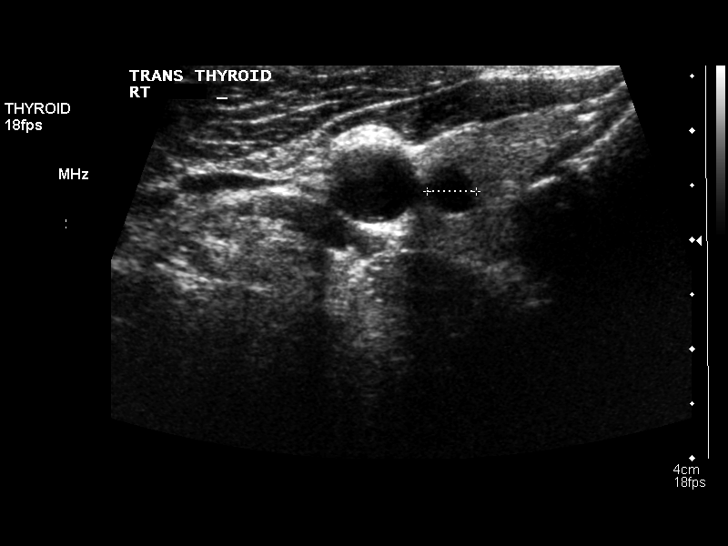
[im 11/64]
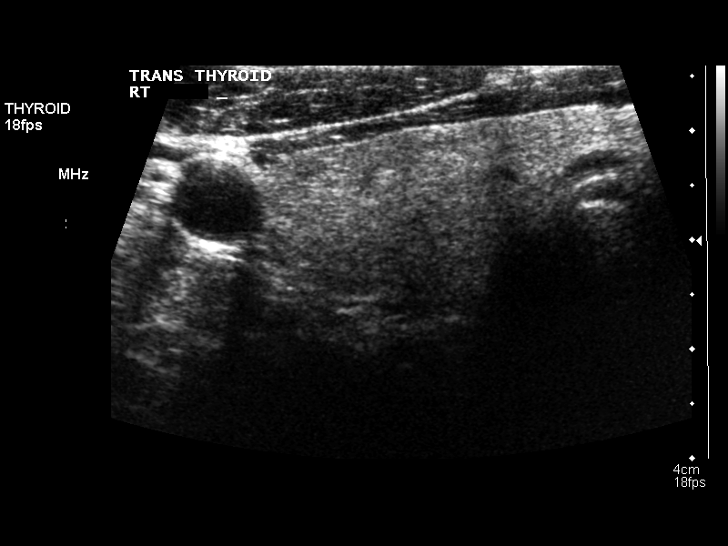
[im 16/64]
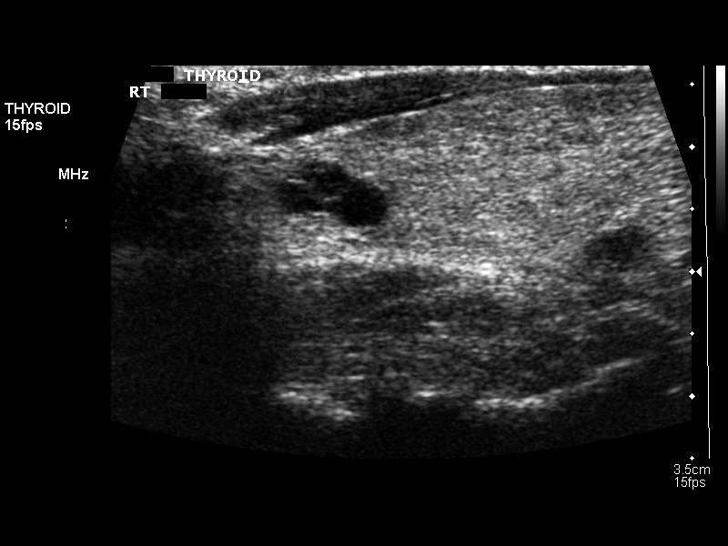
[im 22/64]
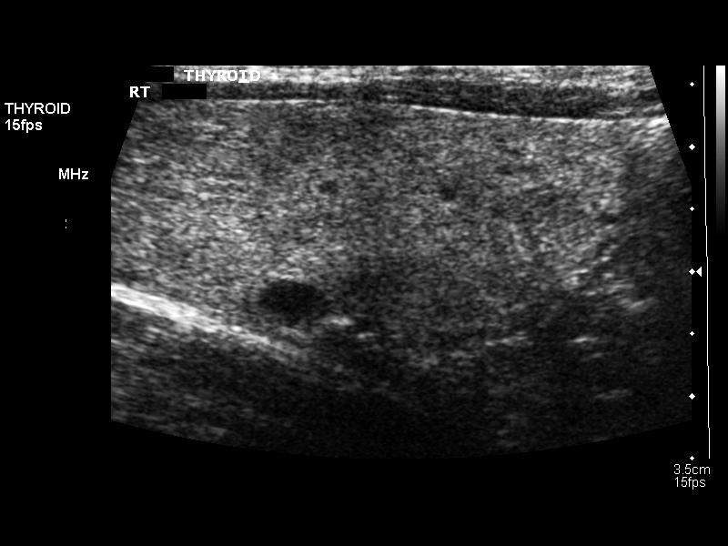
[im 27/64]
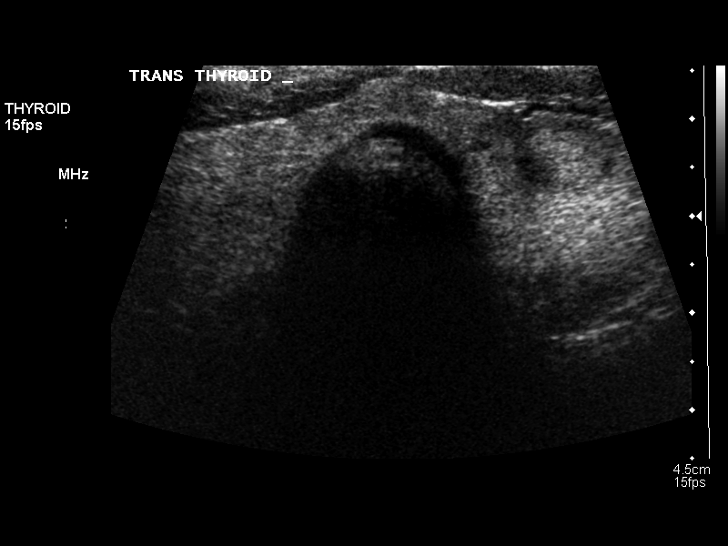
[im 32/64]
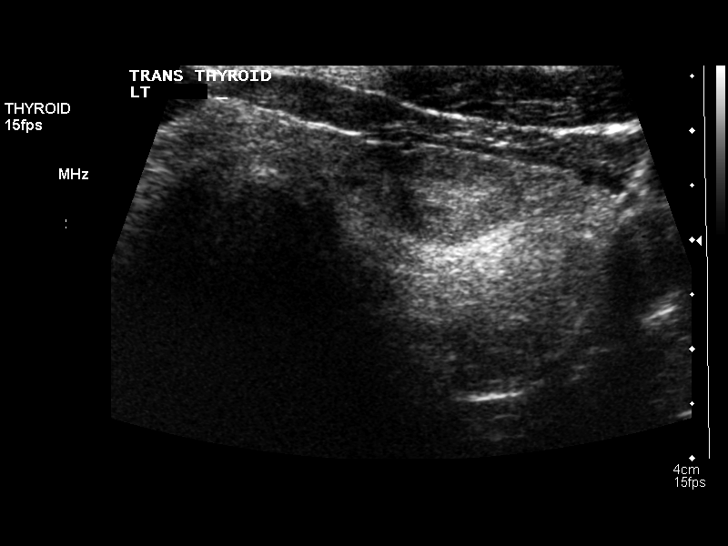
[im 37/64]
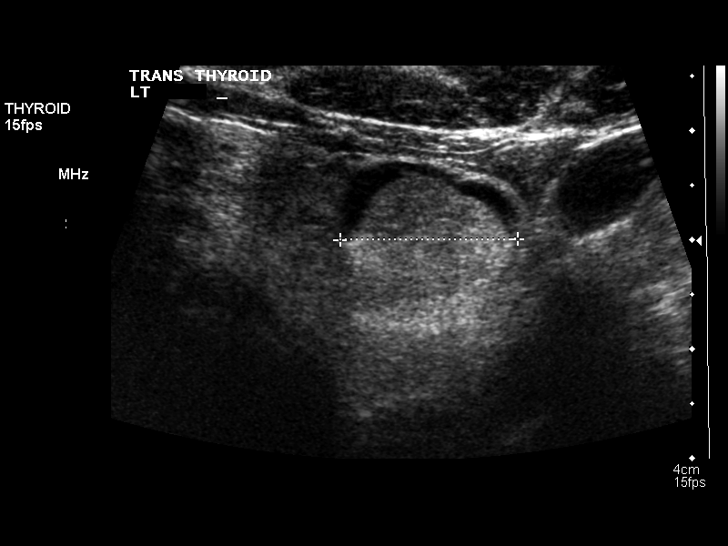
[im 43/64]
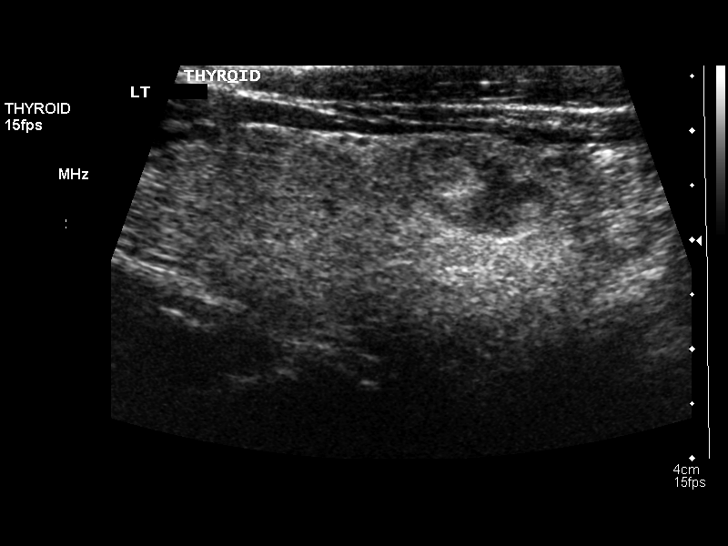
[im 48/64]
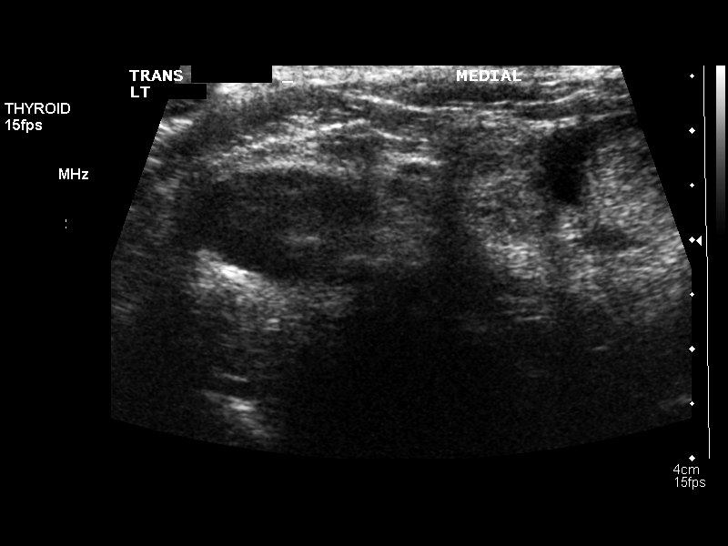
[im 53/64]
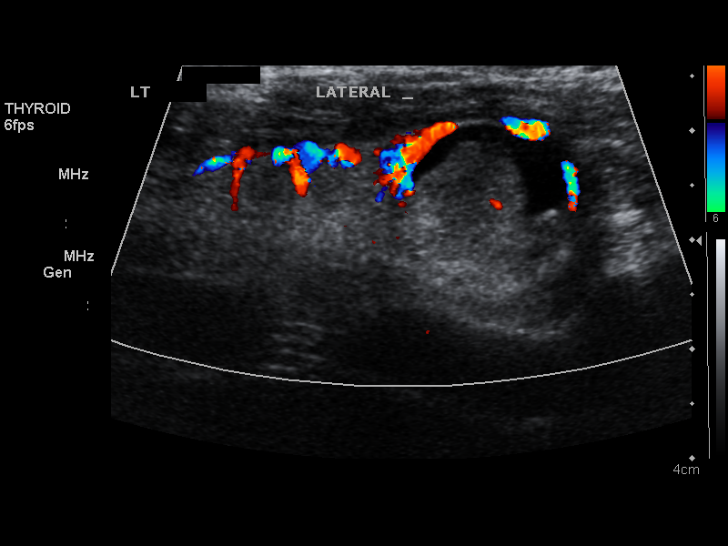
[im 58/64]
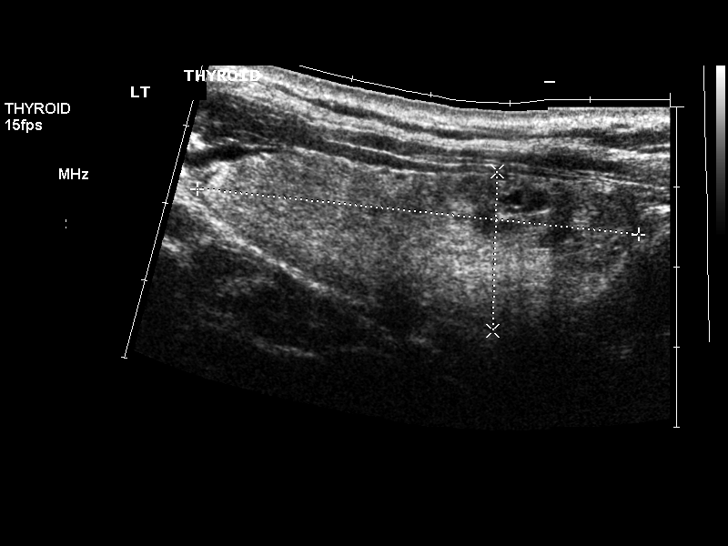
[im 64/64]
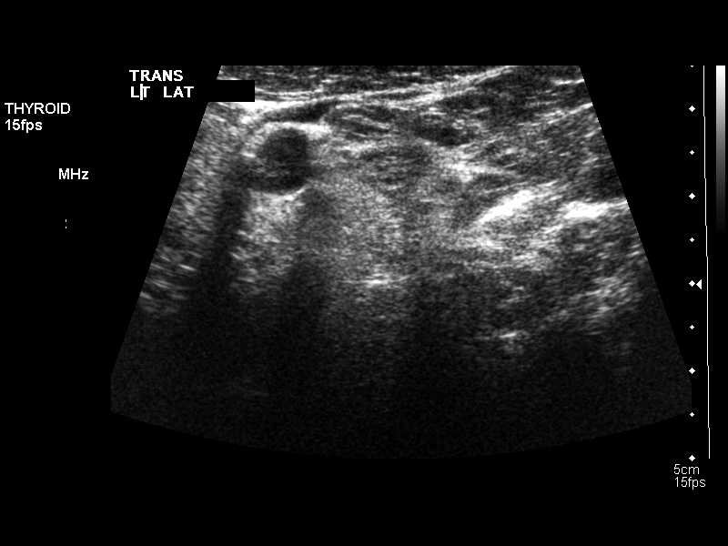

[13 of 25 positions shown; findings below may reference images not displayed]

FINDINGS: Right thyroid lobe

Measurements: 5.8 x 2.2 x 2.6 cm. Mild heterogeneous right thyroid
echotexture. Scattered hypoechoic nodules throughout some of which
are partially cystic all measuring 9 mm or less in size. No
significant right thyroid lesion.

Left thyroid lobe

Measurements: 5.5 x 2.0 x 2.5 cm. Dominant left lower pole complex
partially cystic nodule measures 18 x 16 x 16 mm. This meets
criteria for biopsy. There is a second mid to lower pole adjacent
nodule measuring 15 x 9 x 17 mm. A third left thyroid midpole nodule
measures 10 mm in greatest dimension.

Isthmus

Thickness: 4 mm.  No nodules visualized.

Lymphadenopathy

None visualized.
IMPRESSION: Dominant left inferior thyroid nodule measures 18 mm. This meets
criteria for biopsy.

Other nodules could be followed at 12 months.

Findings meet consensus criteria for biopsy. Ultrasound-guided fine
needle aspiration should be considered, as per the consensus
statement: Management of Thyroid Nodules Detected at US: Society of
Radiologists in Ultrasound Consensus Conference Statement. Radiology

## 2015-03-13 DIAGNOSIS — K219 Gastro-esophageal reflux disease without esophagitis: Secondary | ICD-10-CM | POA: Diagnosis not present

## 2015-03-13 DIAGNOSIS — R200 Anesthesia of skin: Secondary | ICD-10-CM | POA: Diagnosis not present

## 2015-03-13 DIAGNOSIS — I10 Essential (primary) hypertension: Secondary | ICD-10-CM | POA: Diagnosis not present

## 2015-03-13 DIAGNOSIS — R7309 Other abnormal glucose: Secondary | ICD-10-CM | POA: Diagnosis not present

## 2015-03-13 DIAGNOSIS — G5601 Carpal tunnel syndrome, right upper limb: Secondary | ICD-10-CM | POA: Diagnosis not present

## 2015-04-03 ENCOUNTER — Ambulatory Visit (INDEPENDENT_AMBULATORY_CARE_PROVIDER_SITE_OTHER): Payer: Medicare Other | Admitting: Podiatry

## 2015-04-03 ENCOUNTER — Encounter: Payer: Self-pay | Admitting: Podiatry

## 2015-04-03 VITALS — BP 128/76 | HR 79 | Ht 65.0 in | Wt 200.0 lb

## 2015-04-03 DIAGNOSIS — M21969 Unspecified acquired deformity of unspecified lower leg: Secondary | ICD-10-CM | POA: Diagnosis not present

## 2015-04-03 DIAGNOSIS — M65979 Unspecified synovitis and tenosynovitis, unspecified ankle and foot: Secondary | ICD-10-CM

## 2015-04-03 DIAGNOSIS — M21962 Unspecified acquired deformity of left lower leg: Secondary | ICD-10-CM | POA: Diagnosis not present

## 2015-04-03 DIAGNOSIS — M659 Synovitis and tenosynovitis, unspecified: Secondary | ICD-10-CM | POA: Diagnosis not present

## 2015-04-03 MED ORDER — HYDROCODONE-IBUPROFEN 7.5-200 MG PO TABS: 1.0000 | ORAL_TABLET | Freq: Three times a day (TID) | ORAL | Status: AC | PRN

## 2015-04-03 NOTE — Progress Notes (Signed)
Subjective: 69 year old male presents complaining of left foot pain. Previously he had right 5th MPJ pain and was advised to have orthotics. He went out and got the orthotics. Stated that the orthotic helped.  This time he was doing his routine yard work on 03/26/15. At the end of the day left dorsum and mid lateral aspect of the foot got swollen and painful.   Objective: Elevated first ray with hypermobility.  Neurovascular status are within normal. No visible edema or erythema noted.   Assessment: Tenosynovitis mid foot left.  Forefoot varus bilateral. Hypermobile first ray bilateral. Elevated first ray bilateral.  Plan: Reviewed clinical findings and available treatment options. Ankle brace dispensed for left foot. Continue with orthotics or may try custom orthotics. May use Advil 600 mg bid.

## 2015-04-03 NOTE — Patient Instructions (Signed)
Seen for pain in left foot. Has abnormal weight shifting to lateral column and got tenosynovitis after heavy yard work. Ankle brace dispensed. May take Advil 3 of 200 mg pills twice a day.  Vicodin 7.5/325 mg prescribed for prn. Return as needed.

## 2015-04-17 DIAGNOSIS — Z0000 Encounter for general adult medical examination without abnormal findings: Secondary | ICD-10-CM | POA: Diagnosis not present

## 2015-04-17 DIAGNOSIS — E1142 Type 2 diabetes mellitus with diabetic polyneuropathy: Secondary | ICD-10-CM | POA: Diagnosis not present

## 2015-04-17 DIAGNOSIS — I10 Essential (primary) hypertension: Secondary | ICD-10-CM | POA: Diagnosis not present

## 2015-04-17 DIAGNOSIS — E785 Hyperlipidemia, unspecified: Secondary | ICD-10-CM | POA: Diagnosis not present

## 2015-04-17 DIAGNOSIS — Z125 Encounter for screening for malignant neoplasm of prostate: Secondary | ICD-10-CM | POA: Diagnosis not present

## 2015-06-12 ENCOUNTER — Encounter: Payer: Self-pay | Admitting: Podiatry

## 2015-06-12 ENCOUNTER — Ambulatory Visit (INDEPENDENT_AMBULATORY_CARE_PROVIDER_SITE_OTHER): Payer: Medicare Other | Admitting: Podiatry

## 2015-06-12 VITALS — BP 128/85 | HR 70 | Ht 65.0 in | Wt 196.0 lb

## 2015-06-12 DIAGNOSIS — M659 Synovitis and tenosynovitis, unspecified: Secondary | ICD-10-CM

## 2015-06-12 DIAGNOSIS — M21961 Unspecified acquired deformity of right lower leg: Secondary | ICD-10-CM | POA: Diagnosis not present

## 2015-06-12 DIAGNOSIS — M65979 Unspecified synovitis and tenosynovitis, unspecified ankle and foot: Secondary | ICD-10-CM

## 2015-06-12 NOTE — Progress Notes (Signed)
Subjective: 69 year old male presents complaining of right foot pain same as the left. On his last visit the pain was on the left.  The left foot is better now so he used the ankle brace on the right. 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   Objective: Elevated first ray with hypermobility.  Neurovascular status are within normal. No visible edema or erythema noted on affected site, right midfoot dorsolateral.   Assessment: Tenosynovitis mid foot right. Forefoot varus bilateral. Hypermobile first ray bilateral. Elevated first ray bilateral.  Plan: Reviewed clinical findings and available treatment options. Use the Ankle brace for the right foot as needed. Continue with orthotics. May increase Advil to 600 mg bid. He was using once a day. May benefit from Cotton osteotomy with bone graft to balance the forefoot.

## 2015-06-12 NOTE — Patient Instructions (Signed)
Seen for pain in right foot. Having the same condition as the left foot. Continue with ankle brace. May benefit from Cotton osteotomy with bone graft to balance the forefoot.

## 2015-07-01 ENCOUNTER — Encounter: Payer: Self-pay | Admitting: Podiatry

## 2015-07-01 ENCOUNTER — Ambulatory Visit (INDEPENDENT_AMBULATORY_CARE_PROVIDER_SITE_OTHER): Payer: Medicare Other | Admitting: Podiatry

## 2015-07-01 VITALS — BP 111/77 | HR 73

## 2015-07-01 DIAGNOSIS — M21969 Unspecified acquired deformity of unspecified lower leg: Secondary | ICD-10-CM

## 2015-07-01 DIAGNOSIS — M659 Synovitis and tenosynovitis, unspecified: Secondary | ICD-10-CM

## 2015-07-01 DIAGNOSIS — E08618 Diabetes mellitus due to underlying condition with other diabetic arthropathy: Secondary | ICD-10-CM | POA: Diagnosis not present

## 2015-07-01 DIAGNOSIS — M65979 Unspecified synovitis and tenosynovitis, unspecified ankle and foot: Secondary | ICD-10-CM

## 2015-07-01 NOTE — Patient Instructions (Signed)
Both feet measured for diabetic shoes. Continue with ankle brace and advil as needed.

## 2015-07-01 NOTE — Progress Notes (Signed)
Subjective: Patient is here for diabetic shoes. Stated that he still has pain midfoot area with weight bearing on both feet L>R. He was treated for bilateral foot pain. Pain is on top of mid foot area with stiffness and aching pain.  Using brace more on left foot that is helping and takes Advils.   Objective: Dermatologic: Positive of fungal nail with ridged deformity on distal center part of left great toe.  Neurovascular status are within normal. Orthopedic findings show excess sagittal plane motion of the first ray bilateral.   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 History of capsulitis 5th MPJ right. Forefoot varus bilateral. Hypermobile and elevated first ray bilateral.  Plan: Continue with ankle brace as needed. Advil also as needed. Both feet measured for diabetic shoes.

## 2015-07-15 IMAGING — DX DG ANKLE COMPLETE 3+V*L*
3 series · 3 of 3 positions shown · non-contrast
Comparison: None.

CLINICAL DATA: Anterior medial ankle pain.

EXAM:
LEFT ANKLE COMPLETE - 3+ VIEW

[ankle ap]
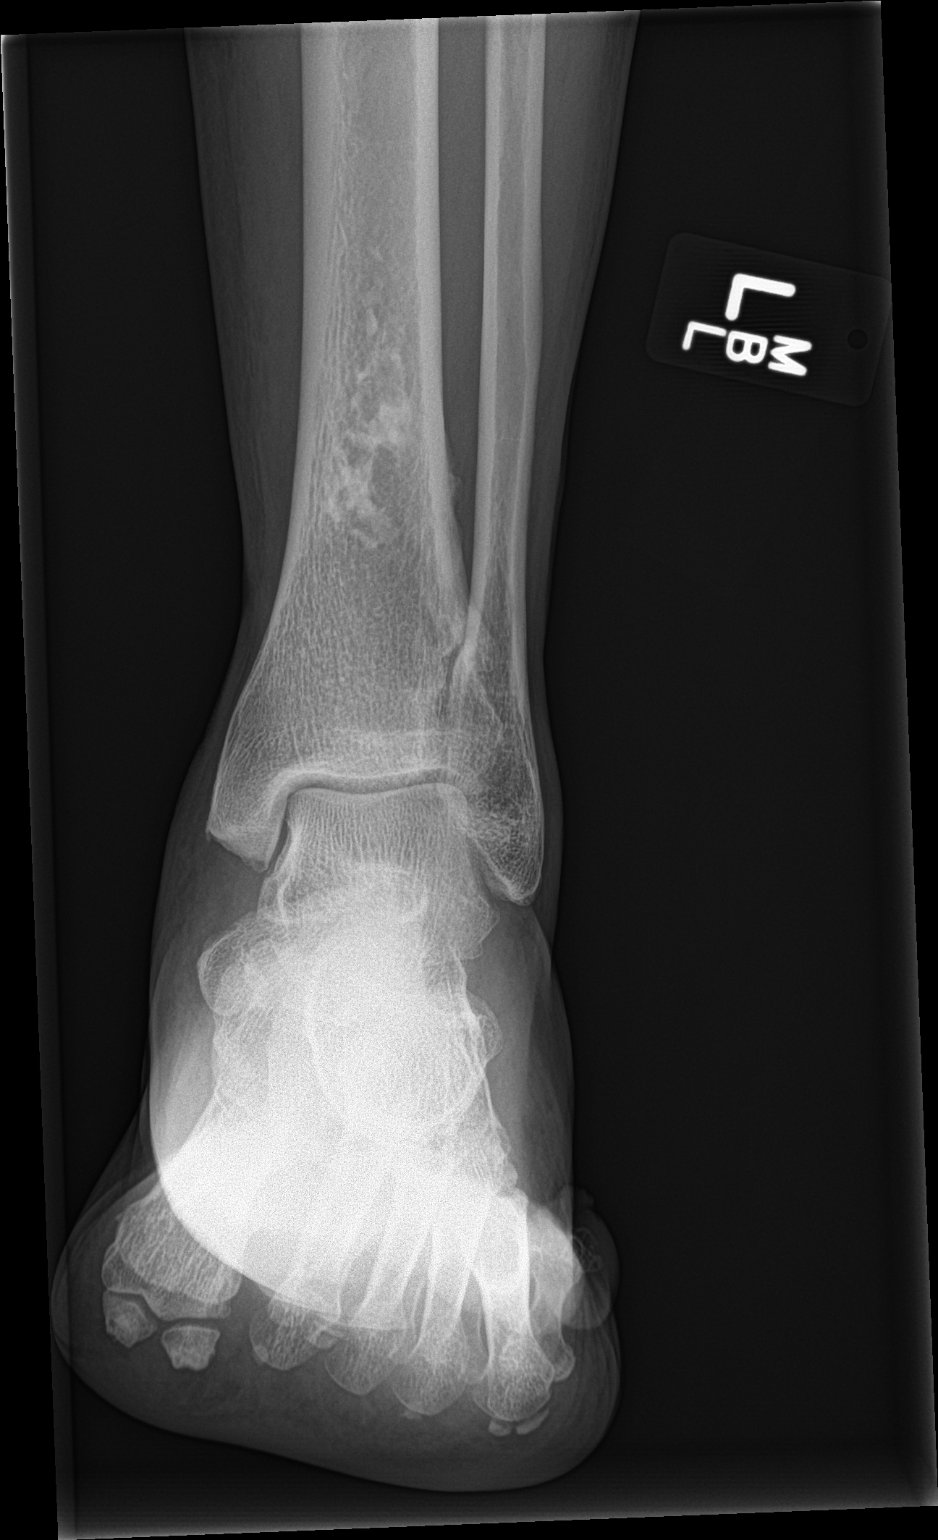

[ankle obl]
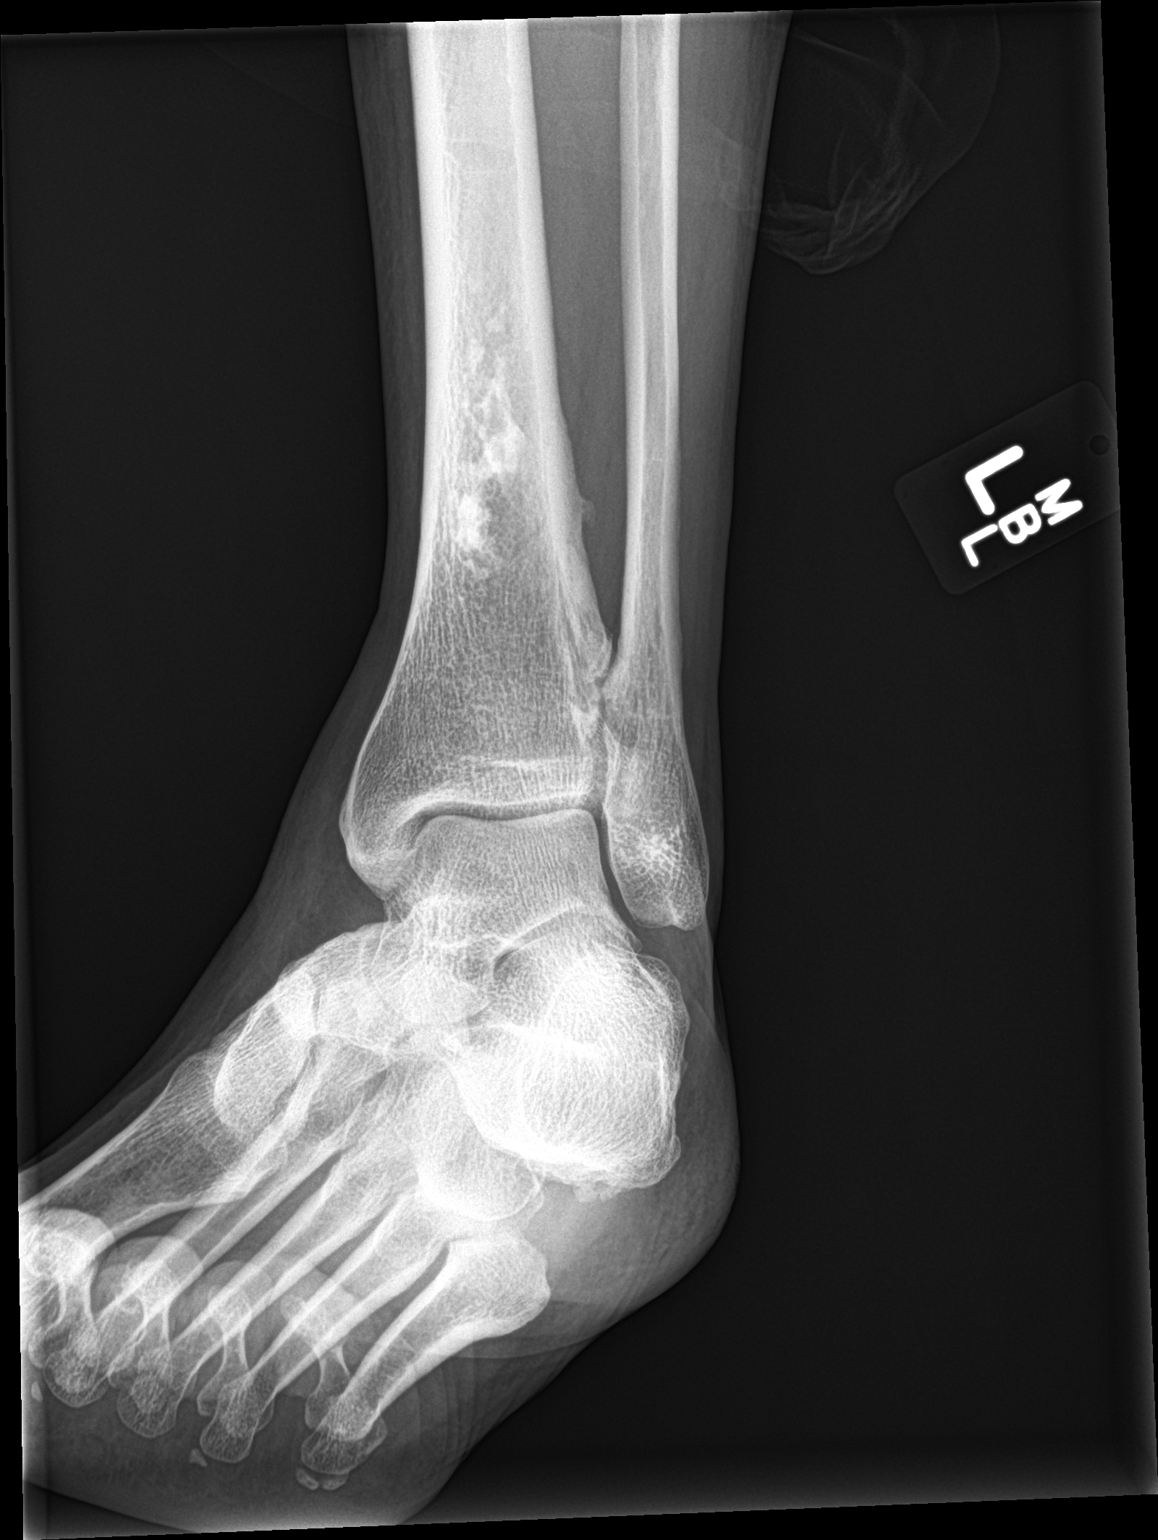

[ankle lat]
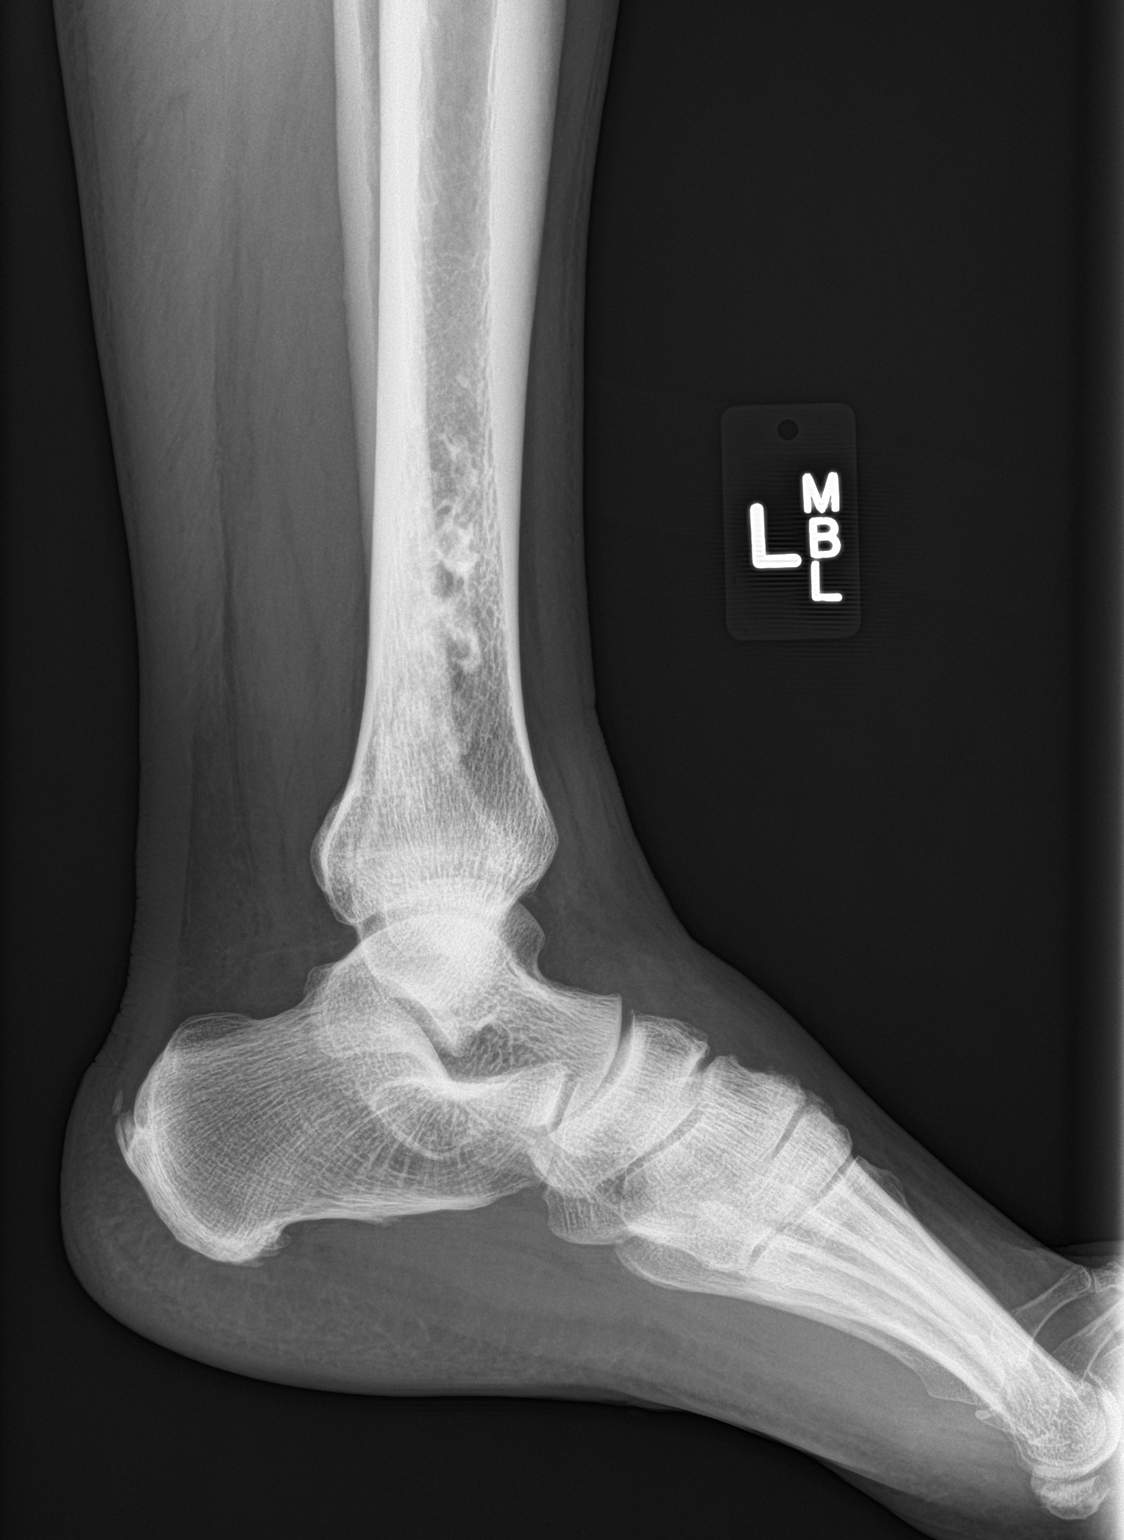

[3 of 3 positions shown; findings below may reference images not displayed]

FINDINGS: There is no evidence of fracture, dislocation, or joint effusion.
The ankle mortise is intact there is no arthropathy. There is a
sclerotic serpiginous elongated abnormality in the distal tibial
diametaphysis without endosteal scalloping, soft tissue mass, bone
destruction or periosteal reaction, likely reflecting a benign
chondroid lesion. There is a small plantar calcaneal spur. Soft
tissues are unremarkable.
IMPRESSION: No acute osseous injury of the left ankle.

## 2015-09-08 ENCOUNTER — Encounter: Payer: Self-pay | Admitting: Podiatry

## 2015-09-08 ENCOUNTER — Ambulatory Visit (INDEPENDENT_AMBULATORY_CARE_PROVIDER_SITE_OTHER): Payer: Medicare Other | Admitting: Podiatry

## 2015-09-08 VITALS — BP 152/81 | HR 71

## 2015-09-08 DIAGNOSIS — M21961 Unspecified acquired deformity of right lower leg: Secondary | ICD-10-CM | POA: Diagnosis not present

## 2015-09-08 DIAGNOSIS — M25571 Pain in right ankle and joints of right foot: Secondary | ICD-10-CM | POA: Diagnosis not present

## 2015-09-08 DIAGNOSIS — M25579 Pain in unspecified ankle and joints of unspecified foot: Secondary | ICD-10-CM | POA: Insufficient documentation

## 2015-09-08 NOTE — Patient Instructions (Signed)
Seen for pain in the first big joint. Cortisone injection given.  Return as needed.

## 2015-09-08 NOTE — Progress Notes (Signed)
Subjective: Patient presents complaining of pain at the first MPJ right duration several weeks. Wants to have surgical procedure in February. Mean time wants to get injection.  Pain is under the bunion area right foot.  Diabetic shoes are not helping the pain.  Objective: Dermatologic: Positive of fungal nail with ridged deformity on distal center part of left great toe.  Neurovascular status are within normal. Orthopedic findings show excess sagittal plane motion of the first ray bilateral.   Assessment: Arthralgia first MPJ right. Hypermobile and elevated first ray bilateral.  Plan: Injected right first MPJ with mixture of 4 mg Dexamethasone, 4 mg Triamcinolone, and 1 cc of 0.5% Marcaine plain. Patient tolerated well without difficulty.

## 2015-09-10 ENCOUNTER — Ambulatory Visit: Payer: Medicare Other | Admitting: Podiatry

## 2015-09-11 ENCOUNTER — Ambulatory Visit: Payer: Medicare Other | Admitting: Podiatry

## 2015-11-05 ENCOUNTER — Encounter: Payer: Self-pay | Admitting: Podiatry

## 2015-11-05 ENCOUNTER — Ambulatory Visit (INDEPENDENT_AMBULATORY_CARE_PROVIDER_SITE_OTHER): Payer: Medicare Other | Admitting: Podiatry

## 2015-11-05 VITALS — BP 131/79 | HR 84

## 2015-11-05 DIAGNOSIS — M216X2 Other acquired deformities of left foot: Secondary | ICD-10-CM | POA: Diagnosis not present

## 2015-11-05 DIAGNOSIS — M216X9 Other acquired deformities of unspecified foot: Secondary | ICD-10-CM | POA: Insufficient documentation

## 2015-11-05 DIAGNOSIS — M659 Synovitis and tenosynovitis, unspecified: Secondary | ICD-10-CM

## 2015-11-05 DIAGNOSIS — M65972 Unspecified synovitis and tenosynovitis, left ankle and foot: Secondary | ICD-10-CM | POA: Insufficient documentation

## 2015-11-05 NOTE — Progress Notes (Signed)
Subjective: 70 year old patient presents complaining of pain at the medial aspect of left ankle at about TNJ area. Patient request injection.   Objective: Dermatologic: No acute changes in foot. Neurovascular status are within normal. Mild edema left ankle medial aspect. Orthopedic findings show excess sagittal plane motion of the first ray bilateral.  Pronating STJ with medially protruding TNJ complex left foot.  Assessment: Arthralgia first MPJ right has subsided at this time. Hypermobile and elevated first ray bilateral. STJ and ankle joint hyperpronation with pain and swelling medial ankle left foot.  Plan: Reviewed findings and available treatment options. Left ankle placed in ankle brace with instruction. Return as needed.

## 2015-11-05 NOTE — Patient Instructions (Signed)
Seen for pain in left ankle medial aspect. Placed in ankle brace. Use during the day. Return as needed.

## 2015-12-22 ENCOUNTER — Encounter: Payer: Self-pay | Admitting: Podiatry

## 2015-12-22 ENCOUNTER — Ambulatory Visit (INDEPENDENT_AMBULATORY_CARE_PROVIDER_SITE_OTHER): Payer: Medicare Other | Admitting: Podiatry

## 2015-12-22 VITALS — BP 143/98 | HR 83

## 2015-12-22 DIAGNOSIS — M216X1 Other acquired deformities of right foot: Secondary | ICD-10-CM

## 2015-12-22 DIAGNOSIS — M659 Synovitis and tenosynovitis, unspecified: Secondary | ICD-10-CM

## 2015-12-22 DIAGNOSIS — M25571 Pain in right ankle and joints of right foot: Secondary | ICD-10-CM

## 2015-12-22 DIAGNOSIS — M21961 Unspecified acquired deformity of right lower leg: Secondary | ICD-10-CM | POA: Diagnosis not present

## 2015-12-22 NOTE — Progress Notes (Signed)
Subjective: 70 year old patient presents complaining of pain  In lateral aspect of right ankle and the pain radiates to back of lower leg for a week.  Last visit he was in for pain in medial aspect of left ankle at about TNJ area. Last injection really helped.  He is on his feet off and on working 6 hours a day.  Objective: Dermatologic: No acute changes in foot. Neurovascular status are within normal. Mild edema left ankle medial aspect. Orthopedic findings show excess sagittal plane motion of the first ray bilateral.  Pronating STJ with medially protruding TNJ complex left foot. Positive of limited dorsiflexion of right ankle joint.   Assessment: Tenosynovitis right ankle lateral. Right ankle Equinus. Hypermobile and elevated first ray bilateral. S/P Subtalar and ankle joint hyperpronation with pain and swelling medial ankle left foot.  Plan: Reviewed findings and available treatment options. Advised to use ankle brace on right. Dispensed Night splint. Reviewed stretch exercise. Reviewed other available treatment options and NSAIA.  Return in 2 weeks.

## 2015-12-22 NOTE — Patient Instructions (Signed)
Seen for pain in right lateral ankle. Noted of tight Achilles tendon and weak first metatarsal bone. Reviewed stretch exercise. May use Night Splint and use anti inflammatory medication. Return in 2 weeks.

## 2015-12-23 ENCOUNTER — Telehealth: Payer: Self-pay | Admitting: *Deleted

## 2015-12-23 MED ORDER — IBUPROFEN 800 MG PO TABS: 800.0000 mg | ORAL_TABLET | Freq: Three times a day (TID) | ORAL | Status: DC | PRN

## 2015-12-23 NOTE — Telephone Encounter (Signed)
12/24/2015 Dr. Raynald KempSheard, Patient called this am and ask could you refill his Ibuprofen please [place] [facility] [place] [place]

## 2015-12-28 ENCOUNTER — Emergency Department (INDEPENDENT_AMBULATORY_CARE_PROVIDER_SITE_OTHER)
Admission: EM | Admit: 2015-12-28 | Discharge: 2015-12-28 | Disposition: A | Discharge status: Home / Self Care | Payer: Medicare Other | Source: Home / Self Care | Attending: Family Medicine | Admitting: Family Medicine

## 2015-12-28 ENCOUNTER — Emergency Department (INDEPENDENT_AMBULATORY_CARE_PROVIDER_SITE_OTHER): Payer: Medicare Other

## 2015-12-28 ENCOUNTER — Encounter (HOSPITAL_COMMUNITY): Payer: Self-pay | Admitting: Emergency Medicine

## 2015-12-28 DIAGNOSIS — S93401S Sprain of unspecified ligament of right ankle, sequela: Secondary | ICD-10-CM

## 2015-12-28 DIAGNOSIS — M7989 Other specified soft tissue disorders: Secondary | ICD-10-CM | POA: Diagnosis not present

## 2015-12-28 NOTE — ED Notes (Signed)
Patient c/o right ankle pain and possible sprain. Patient reports he was seen [facility] the [facility] and given pain medicine however he did not have an xray and he would like one today. Patient is wearing ASO which helps him tolerate pain. Patient is in NAD.

## 2015-12-28 NOTE — Discharge Instructions (Signed)
Ankle Sprain °An ankle sprain is an injury to the strong, fibrous tissues (ligaments) that hold your ankle bones together.  °HOME CARE  °· Put ice on your ankle for 1-2 days or as told by your doctor. °¨ Put ice in a plastic bag. °¨ Place a towel between your skin and the bag. °¨ Leave the ice on for 15-20 minutes at a time, every 2 hours while you are awake. °· Only take medicine as told by your doctor. °· Raise (elevate) your injured ankle above the level of your heart as much as possible for 2-3 days. °· Use crutches if your doctor tells you to. Slowly put your own weight on the affected ankle. Use the crutches until you can walk without pain. °· If you have a plaster splint: °¨ Do not rest it on anything harder than a pillow for 24 hours. °¨ Do not put weight on it. °¨ Do not get it wet. °¨ Take it off to shower or bathe. °· If given, use an elastic wrap or support stocking for support. Take the wrap off if your toes lose feeling (numb), tingle, or turn cold or blue. °· If you have an air splint: °¨ Add or let out air to make it comfortable. °¨ Take it off at night and to shower and bathe. °¨ Wiggle your toes and move your ankle up and down often while you are wearing it. °GET HELP IF: °· You have rapidly increasing bruising or puffiness (swelling). °· Your toes feel very cold. °· You lose feeling in your foot. °· Your medicine does not help your pain. °GET HELP RIGHT AWAY IF:  °· Your toes lose feeling (numb) or turn blue. °· You have severe pain that is increasing. °MAKE SURE YOU:  °· Understand these instructions. °· Will watch your condition. °· Will get help right away if you are not doing well or get worse. °  °This information is not intended to replace advice given to you by your health care provider. Make sure you discuss any questions you have with your health care provider. °  °Document Released: 03/07/2008 Document Revised: 10/10/2014 Document Reviewed: 04/02/2012 °Elsevier Interactive Patient  Education ©2016 [employer] ° °

## 2015-12-28 NOTE — ED Provider Notes (Signed)
CSN: [id number]     Arrival date & time 12/28/15  1459 History   First MD Initiated Contact with Patient 12/28/15 1701     Chief Complaint  Patient presents with  . Ankle Pain   (Consider location/radiation/quality/duration/timing/severity/associated sxs/prior Treatment) HPI History obtained from patient:   LOCATION:right ankle SEVERITY:2 DURATION:2 weeks CONTEXT:twisted while walking outside QUALITY: MODIFYING FACTORS:was seen by podiatry, no xray done, placed in ASO ASSOCIATED SYMPTOMS:swelling of lateral ankle TIMING:constant OCCUPATION:retired.   Past Medical History  Diagnosis Date  . Anemia   . Prediabetes   . GERD (gastroesophageal reflux disease)   . Hypertension   . Colorectal polyps    Past Surgical History  Procedure Laterality Date  . Cholecystectomy     No family history on file. Social History  Substance Use Topics  . Smoking status: Former Smoker    Types: Cigarettes    Quit date: 10/03/1978  . Smokeless tobacco: Never Used  . Alcohol Use: No    Review of Systems Right ankle pain and swelling Allergies  Lisinopril  Home Medications   Prior to Admission medications   Medication Sig Start Date End Date Taking? Authorizing Provider  amLODipine (NORVASC) 5 MG tablet Take 5 mg by mouth daily.   Yes Historical Provider, MD  atorvastatin (LIPITOR) 20 MG tablet  03/26/15  Yes Historical Provider, MD  carvedilol (COREG) 6.25 MG tablet Take 1 tablet (6.25 mg total) by mouth daily. 12/23/12  Yes Jimmie MollyPaolo Coll, MD  cholecalciferol (VITAMIN D) 1000 UNITS tablet Take 5,000 Units by mouth daily.    Yes Historical Provider, MD  hydrochlorothiazide (HYDRODIURIL) 25 MG tablet Take 1 tablet (25 mg total) by mouth daily. 12/23/12  Yes Jimmie MollyPaolo Coll, MD  HYDROcodone-ibuprofen (VICOPROFEN) 7.5-200 MG per tablet Take 1 tablet by mouth every 8 (eight) hours as needed for moderate pain. 04/03/15  Yes Myeong O Sheard, DPM  ibuprofen (ADVIL,MOTRIN) 800 MG tablet Take 1 tablet (800  mg total) by mouth every 8 (eight) hours as needed. 12/23/15  Yes Myeong O Sheard, DPM  Magnesium 250 MG TABS Take 1 tablet by mouth daily.   Yes Historical Provider, MD  meloxicam (MOBIC) 7.5 MG tablet Take 1 tablet (7.5 mg total) by mouth 2 (two) times daily after a meal. 11/17/14  Yes Linna HoffJames D Kindl, MD  metFORMIN (GLUCOPHAGE) 500 MG tablet  03/16/15  Yes Historical Provider, MD  nabumetone (RELAFEN) 500 MG tablet Take 1 tablet (500 mg total) by mouth 2 (two) times daily. 08/28/13  Yes Myeong O Sheard, DPM  Potassium Gluconate 595 MG CAPS Take 1 capsule by mouth daily.   Yes Historical Provider, MD  pravastatin (PRAVACHOL) 20 MG tablet Take 20 mg by mouth daily.   Yes Historical Provider, MD   Meds Ordered and Administered this Visit  Medications - No data to display  BP 150/85 mmHg  Pulse 106  Temp(Src) 97.8 F (36.6 C) (Oral)  Resp 14  SpO2 100% No data found.   Physical Exam  Constitutional: He is oriented to person, place, and time. He appears well-developed and well-nourished.  Pulmonary/Chest: Effort normal.  Musculoskeletal: He exhibits tenderness.       Right ankle: He exhibits decreased range of motion and swelling. He exhibits no deformity and no laceration. Tenderness. Lateral malleolus tenderness found. No proximal fibula tenderness found. Achilles tendon normal. Achilles tendon exhibits normal Thompson's test results.       Feet:  Neurological: He is alert and oriented to person, place, and time.  Skin: Skin is  warm and dry.  Nursing note and vitals reviewed.   ED Course  Procedures (including critical care time)  Labs Review Labs Reviewed - No data to display  Imaging Review No results found.   Visual Acuity Review  Right Eye Distance:   Left Eye Distance:   Bilateral Distance:    Right Eye Near:   Left Eye Near:    Bilateral Near:       Review xray with patient: No fracture identified.  Pt has ASO  MDM   1. Ankle sprain, right, sequela      Patient is reassured that there are no issues that require transfer to higher level of care at this time or additional tests. Patient is advised to continue home symptomatic treatment. Patient is advised that if there are new or worsening symptoms to attend the emergency department, contact primary care provider, or return to UC. Instructions of care provided discharged home in stable condition. Return to work/school note provided.   THIS NOTE WAS GENERATED USING A VOICE RECOGNITION SOFTWARE PROGRAM. ALL REASONABLE EFFORTS  WERE MADE TO PROOFREAD THIS DOCUMENT FOR ACCURACY.  I have verbally reviewed the discharge instructions with the patient. A printed AVS was given to the patient.  All questions were answered prior to discharge.      Tharon Aquas, PA 12/28/15 2112

## 2016-01-05 ENCOUNTER — Encounter: Payer: Self-pay | Admitting: Podiatry

## 2016-01-05 ENCOUNTER — Ambulatory Visit (INDEPENDENT_AMBULATORY_CARE_PROVIDER_SITE_OTHER): Payer: Medicare Other | Admitting: Podiatry

## 2016-01-05 DIAGNOSIS — B351 Tinea unguium: Secondary | ICD-10-CM | POA: Diagnosis not present

## 2016-01-05 DIAGNOSIS — M25571 Pain in right ankle and joints of right foot: Secondary | ICD-10-CM | POA: Diagnosis not present

## 2016-01-05 DIAGNOSIS — M21969 Unspecified acquired deformity of unspecified lower leg: Secondary | ICD-10-CM | POA: Diagnosis not present

## 2016-01-05 NOTE — Progress Notes (Signed)
Subjective: 70 year old patient presents the right foot posterolateral heel is much better than 2 weeks ago.  Doing stretch exercise. Taking Ibuprofen.  Has had severe pain last week and got X-ray done. Was told a bad sprain.   complaining of pain  In lateral aspect of right ankle and the pain radiates to back of lower leg for a week.  Last visit he was in for pain in medial aspect of left ankle at about TNJ area. Last injection really helped.  He is on his feet off and on working 6 hours a day.  Objective: Dermatologic: No acute changes in foot. Neurovascular status are within normal. Mild edema left ankle medial aspect. Orthopedic findings show excess sagittal plane motion of the first ray bilateral.  Pronating STJ with medially protruding TNJ complex left foot. Positive of limited dorsiflexion of right ankle joint.   Assessment: Tenosynovitis right ankle lateral. Right ankle Equinus. Hypermobile and elevated first ray bilateral. S/P Subtalar and ankle joint hyperpronation with pain and swelling medial ankle left foot.  Plan: Reviewed findings and available treatment options. Advised to use ankle brace on right. Dispensed Night splint. Reviewed stretch exercise. Reviewed other available treatment options and NSAIA.  Return in 2 weeks.

## 2016-01-05 NOTE — Patient Instructions (Signed)
Seen for right heel pain. Doing better with exercise and Ibuprofen. Debrided problematic nails.  Continue with exercise and return as needed.

## 2016-03-08 DIAGNOSIS — H43811 Vitreous degeneration, right eye: Secondary | ICD-10-CM | POA: Diagnosis not present

## 2016-03-08 DIAGNOSIS — E113291 Type 2 diabetes mellitus with mild nonproliferative diabetic retinopathy without macular edema, right eye: Secondary | ICD-10-CM | POA: Diagnosis not present

## 2016-03-17 DIAGNOSIS — E119 Type 2 diabetes mellitus without complications: Secondary | ICD-10-CM | POA: Diagnosis not present

## 2016-03-17 DIAGNOSIS — H2513 Age-related nuclear cataract, bilateral: Secondary | ICD-10-CM | POA: Diagnosis not present

## 2016-03-17 DIAGNOSIS — H35372 Puckering of macula, left eye: Secondary | ICD-10-CM | POA: Diagnosis not present

## 2016-04-06 ENCOUNTER — Encounter: Payer: Self-pay | Admitting: Podiatry

## 2016-04-06 ENCOUNTER — Ambulatory Visit (INDEPENDENT_AMBULATORY_CARE_PROVIDER_SITE_OTHER): Payer: Medicare Other | Admitting: Podiatry

## 2016-04-06 DIAGNOSIS — M79672 Pain in left foot: Secondary | ICD-10-CM

## 2016-04-06 DIAGNOSIS — M65871 Other synovitis and tenosynovitis, right ankle and foot: Secondary | ICD-10-CM

## 2016-04-06 DIAGNOSIS — M216X1 Other acquired deformities of right foot: Secondary | ICD-10-CM | POA: Diagnosis not present

## 2016-04-06 DIAGNOSIS — M659 Synovitis and tenosynovitis, unspecified: Secondary | ICD-10-CM

## 2016-04-06 DIAGNOSIS — M25571 Pain in right ankle and joints of right foot: Secondary | ICD-10-CM

## 2016-04-06 DIAGNOSIS — M21961 Unspecified acquired deformity of right lower leg: Secondary | ICD-10-CM

## 2016-04-06 NOTE — Patient Instructions (Signed)
Seen for painful feet. Discussed possible surgical intervention, Cotton Osteotomy with Bone graft.  Return as needed.

## 2016-04-06 NOTE — Progress Notes (Signed)
Subjective: 70 year old patient presents complaining of pain in ball of both feet right >left. Pain is across the ankle joint with lateral column pain on right. Wearing lace up tennis shoes with orthotics.  Objective: Dermatologic: No acute changes in foot. Neurovascular status are within normal. Mild edema left ankle medial aspect. Orthopedic findings show excess sagittal plane motion of the first ray bilateral.  Pronating STJ with medially protruding TNJ complex left foot. Positive of limited dorsiflexion of right ankle joint.   Assessment: Tenosynovitis right ankle lateral. Right ankle Equinus. Hypermobile and elevated first ray bilateral. S/P Subtalar and ankle joint hyperpronation with pain and swelling medial ankle left foot.  Plan: Reviewed findings and available treatment options including surgery.  Return as needed.

## 2016-06-02 ENCOUNTER — Other Ambulatory Visit: Payer: Self-pay | Admitting: Podiatry

## 2016-06-15 ENCOUNTER — Encounter: Payer: Self-pay | Admitting: Podiatry

## 2016-06-15 ENCOUNTER — Ambulatory Visit (INDEPENDENT_AMBULATORY_CARE_PROVIDER_SITE_OTHER): Payer: Medicare Other | Admitting: Podiatry

## 2016-06-15 VITALS — BP 140/84 | HR 82

## 2016-06-15 DIAGNOSIS — M216X2 Other acquired deformities of left foot: Secondary | ICD-10-CM | POA: Diagnosis not present

## 2016-06-15 DIAGNOSIS — R609 Edema, unspecified: Secondary | ICD-10-CM | POA: Diagnosis not present

## 2016-06-15 DIAGNOSIS — M76822 Posterior tibial tendinitis, left leg: Secondary | ICD-10-CM | POA: Diagnosis not present

## 2016-06-15 DIAGNOSIS — R600 Localized edema: Secondary | ICD-10-CM

## 2016-06-15 NOTE — Progress Notes (Signed)
Subjective: 70 year old patient presents complaining of pain in left foot at medial ankle and anterior mid foot, requesting injection and schedule for surgery.Patient was accompanied by his wife. Patient is wearing ankle support.  Also treated with orthotics.   Objective: Dermatologic: No acute changes in foot. Neurovascular status are within normal. Mild edema left ankle medial aspect. Orthopedic findings show excess sagittal plane motion of the first ray bilateral.  Pronating STJ with medially protruding TNJ complex left foot. Positive of limited dorsiflexion of right ankle joint.   Assessment: Tenosynovitis along the course of Tibialis posterior tendon left.  Right ankle Equinus. Hypermobile and elevated first ray bilateral. Subtalar and ankle joint hyperpronation with pain and swelling medial ankle left foot.   Plan: Reviewed findings and available treatment options, Ritch brace, and possible surgery if conservative treatment fails to improve condition. As per request, course of the left posterior tibialis tendon plantar medial to medial malleoli Injected with mixture of 4 mg Dexamethasone, 4 mg Triamcinolone, and 1 cc of 0.5% Marcaine plain. Patient tolerated well without difficulty.  Will check on coverage for Ritch brace. Will call the patient with information.

## 2016-06-15 NOTE — Patient Instructions (Signed)
Seen for pain in left ankle. All options reviewed. Injection given to left ankle area. Will get info on Ritch brace coverage.

## 2016-06-29 DIAGNOSIS — Z7984 Long term (current) use of oral hypoglycemic drugs: Secondary | ICD-10-CM | POA: Diagnosis not present

## 2016-06-29 DIAGNOSIS — E7800 Pure hypercholesterolemia, unspecified: Secondary | ICD-10-CM | POA: Diagnosis not present

## 2016-06-29 DIAGNOSIS — I10 Essential (primary) hypertension: Secondary | ICD-10-CM | POA: Diagnosis not present

## 2016-06-29 DIAGNOSIS — M545 Low back pain: Secondary | ICD-10-CM | POA: Diagnosis not present

## 2016-06-29 DIAGNOSIS — Z125 Encounter for screening for malignant neoplasm of prostate: Secondary | ICD-10-CM | POA: Diagnosis not present

## 2016-06-29 DIAGNOSIS — E1142 Type 2 diabetes mellitus with diabetic polyneuropathy: Secondary | ICD-10-CM | POA: Diagnosis not present

## 2016-06-29 DIAGNOSIS — N183 Chronic kidney disease, stage 3 (moderate): Secondary | ICD-10-CM | POA: Diagnosis not present

## 2016-06-29 DIAGNOSIS — Z136 Encounter for screening for cardiovascular disorders: Secondary | ICD-10-CM | POA: Diagnosis not present

## 2016-06-29 DIAGNOSIS — Z0000 Encounter for general adult medical examination without abnormal findings: Secondary | ICD-10-CM | POA: Diagnosis not present

## 2016-06-29 DIAGNOSIS — Z23 Encounter for immunization: Secondary | ICD-10-CM | POA: Diagnosis not present

## 2016-06-30 ENCOUNTER — Other Ambulatory Visit: Payer: Self-pay | Admitting: Family Medicine

## 2016-06-30 DIAGNOSIS — Z136 Encounter for screening for cardiovascular disorders: Secondary | ICD-10-CM

## 2016-07-11 DIAGNOSIS — I10 Essential (primary) hypertension: Secondary | ICD-10-CM | POA: Diagnosis not present

## 2016-07-11 DIAGNOSIS — E1142 Type 2 diabetes mellitus with diabetic polyneuropathy: Secondary | ICD-10-CM | POA: Diagnosis not present

## 2016-07-11 DIAGNOSIS — Z125 Encounter for screening for malignant neoplasm of prostate: Secondary | ICD-10-CM | POA: Diagnosis not present

## 2016-07-11 DIAGNOSIS — E7800 Pure hypercholesterolemia, unspecified: Secondary | ICD-10-CM | POA: Diagnosis not present

## 2016-07-15 ENCOUNTER — Encounter (HOSPITAL_COMMUNITY): Payer: Self-pay

## 2016-07-15 ENCOUNTER — Emergency Department (HOSPITAL_COMMUNITY)
Admission: EM | Admit: 2016-07-15 | Discharge: 2016-07-15 | Disposition: A | Discharge status: Home / Self Care | Payer: Medicare Other | Attending: Emergency Medicine | Admitting: Emergency Medicine

## 2016-07-15 ENCOUNTER — Other Ambulatory Visit: Payer: Self-pay | Admitting: Family Medicine

## 2016-07-15 ENCOUNTER — Ambulatory Visit
Admission: RE | Admit: 2016-07-15 | Discharge: 2016-07-15 | Disposition: A | Discharge status: Home / Self Care | Payer: Medicare Other | Source: Ambulatory Visit | Attending: Family Medicine | Admitting: Family Medicine

## 2016-07-15 ENCOUNTER — Emergency Department (HOSPITAL_COMMUNITY): Payer: Medicare Other

## 2016-07-15 DIAGNOSIS — Z87891 Personal history of nicotine dependence: Secondary | ICD-10-CM | POA: Insufficient documentation

## 2016-07-15 DIAGNOSIS — Z136 Encounter for screening for cardiovascular disorders: Secondary | ICD-10-CM

## 2016-07-15 DIAGNOSIS — E119 Type 2 diabetes mellitus without complications: Secondary | ICD-10-CM | POA: Diagnosis not present

## 2016-07-15 DIAGNOSIS — I10 Essential (primary) hypertension: Secondary | ICD-10-CM | POA: Insufficient documentation

## 2016-07-15 DIAGNOSIS — Z7984 Long term (current) use of oral hypoglycemic drugs: Secondary | ICD-10-CM | POA: Insufficient documentation

## 2016-07-15 DIAGNOSIS — M25571 Pain in right ankle and joints of right foot: Secondary | ICD-10-CM | POA: Diagnosis not present

## 2016-07-15 DIAGNOSIS — M545 Low back pain: Secondary | ICD-10-CM | POA: Diagnosis not present

## 2016-07-15 DIAGNOSIS — Z79899 Other long term (current) drug therapy: Secondary | ICD-10-CM | POA: Diagnosis not present

## 2016-07-15 DIAGNOSIS — M62830 Muscle spasm of back: Secondary | ICD-10-CM | POA: Insufficient documentation

## 2016-07-15 DIAGNOSIS — M25572 Pain in left ankle and joints of left foot: Secondary | ICD-10-CM | POA: Diagnosis not present

## 2016-07-15 DIAGNOSIS — Z791 Long term (current) use of non-steroidal anti-inflammatories (NSAID): Secondary | ICD-10-CM | POA: Insufficient documentation

## 2016-07-15 LAB — URINE MICROSCOPIC-ADD ON
Bacteria, UA: NONE SEEN
Squamous Epithelial / LPF: NONE SEEN
WBC, UA: NONE SEEN WBC/hpf (ref 0–5)

## 2016-07-15 LAB — URINALYSIS, ROUTINE W REFLEX MICROSCOPIC
BILIRUBIN URINE: NEGATIVE
GLUCOSE, UA: NEGATIVE mg/dL
KETONES UR: NEGATIVE mg/dL
Leukocytes, UA: NEGATIVE
Nitrite: NEGATIVE
PH: 6 (ref 5.0–8.0)
PROTEIN: NEGATIVE mg/dL
Specific Gravity, Urine: 1.02 (ref 1.005–1.030)

## 2016-07-15 MED ORDER — LIDOCAINE 5 % EX PTCH: 1.0000 | MEDICATED_PATCH | CUTANEOUS | 0 refills | Status: AC

## 2016-07-15 MED ORDER — DICLOFENAC SODIUM ER 100 MG PO TB24: 100.0000 mg | ORAL_TABLET | Freq: Every day | ORAL | 0 refills | Status: AC

## 2016-07-15 MED ORDER — KETOROLAC TROMETHAMINE 60 MG/2ML IM SOLN
60.0000 mg | Freq: Once | INTRAMUSCULAR | Status: AC
  Administered 2016-07-15: 60 mg via INTRAMUSCULAR
  Filled 2016-07-15: qty 2

## 2016-07-15 MED ORDER — LIDOCAINE 5 % EX PTCH
1.0000 | MEDICATED_PATCH | CUTANEOUS | Status: DC
  Administered 2016-07-15: 1 via TRANSDERMAL
  Filled 2016-07-15: qty 1

## 2016-07-15 NOTE — ED Triage Notes (Signed)
Pt states that Dr Tiburcio PeaHarris gave him a muscle relaxer this week but he didn't like the way it made him feel

## 2016-07-15 NOTE — ED Provider Notes (Signed)
[facility]-EMERGENCY DEPT Provider Note   CSN: [id number] Arrival date & time: 07/15/16  8295     History   Chief Complaint Chief Complaint  Patient presents with  . Back Pain    HPI Joel BUSBY Sr. is a 70 y.o. male.  The history is provided by the patient.  Back Pain   This is a chronic problem. The current episode started more than 1 week ago (more than a month ago). The problem occurs constantly. The problem has not changed since onset.Associated with: swimming and power washing. The pain is present in the lumbar spine. The quality of the pain is described as stabbing. The pain does not radiate. The pain is moderate. The symptoms are aggravated by bending, certain positions and twisting. The pain is the same all the time. Pertinent negatives include no chest pain, no fever, no numbness, no weight loss, no headaches, no abdominal pain, no abdominal swelling, no bowel incontinence, no perianal numbness, no bladder incontinence, no dysuria, no pelvic pain, no leg pain, no paresthesias, no paresis, no tingling and no weakness. He has tried NSAIDs (had improvement with muscle relaxants but didn't like side effects so he threw them away) for the symptoms. The treatment provided mild relief. Risk factors include obesity.    Past Medical History:  Diagnosis Date  . Anemia   . Colorectal polyps   . GERD (gastroesophageal reflux disease)   . Hypertension   . Prediabetes     Patient Active Problem List   Diagnosis Date Noted  . Tenosynovitis of left ankle 11/05/2015  . Pronation deformity of ankle, acquired 11/05/2015  . Arthralgia of foot 09/08/2015  . 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 Tenosynovitis of foot 04/03/2015  . Metatarsal deformity 08/28/2013  . Capsulitis of foot 08/28/2013    Past Surgical History:  Procedure Laterality Date  . CHOLECYSTECTOMY         Home Medications    Prior to Admission medications    Medication Sig Start Date End Date Taking? Authorizing Provider  amLODipine (NORVASC) 5 MG tablet Take 5 mg by mouth daily.    Historical Provider, MD  atorvastatin (LIPITOR) 20 MG tablet  03/26/15   Historical Provider, MD  carvedilol (COREG) 6.25 MG tablet Take 1 tablet (6.25 mg total) by mouth daily. 12/23/12   Jimmie Molly, MD  cholecalciferol (VITAMIN D) 1000 UNITS tablet Take 5,000 Units by mouth daily.     Historical Provider, MD  hydrochlorothiazide (HYDRODIURIL) 25 MG tablet Take 1 tablet (25 mg total) by mouth daily. 12/23/12   Jimmie Molly, MD  HYDROcodone-ibuprofen (VICOPROFEN) 7.5-200 MG per tablet Take 1 tablet by mouth every 8 (eight) hours as needed for moderate pain. 04/03/15   Myeong O Sheard, DPM  ibuprofen (ADVIL,MOTRIN) 800 MG tablet TAKE ONE TABLET BY MOUTH EVERY 8 HOURS AS NEEDED 06/03/16   Myeong O Sheard, DPM  Magnesium 250 MG TABS Take 1 tablet by mouth daily.    Historical Provider, MD  meloxicam (MOBIC) 7.5 MG tablet Take 1 tablet (7.5 mg total) by mouth 2 (two) times daily after a meal. 11/17/14   Linna Hoff, MD  metFORMIN (GLUCOPHAGE) 500 MG tablet  03/16/15   Historical Provider, MD  nabumetone (RELAFEN) 500 MG tablet Take 1 tablet (500 mg total) by mouth 2 (two) times daily. 08/28/13   Myeong O Sheard, DPM  Potassium Gluconate 595 MG CAPS Take 1 capsule by mouth daily.    Historical Provider, MD  pravastatin (PRAVACHOL) 20 MG tablet Take 20 mg by mouth daily.    Historical Provider, MD    Family History History reviewed. No pertinent family history.  Social History Social History  Substance Use Topics  . Smoking status: Former Smoker    Types: Cigarettes    Quit date: 10/03/1978  . Smokeless tobacco: Never Used  . Alcohol use No     Allergies   Lisinopril   Review of Systems Review of Systems  Constitutional: Negative for fever and weight loss.  Respiratory: Negative for chest tightness and shortness of breath.   Cardiovascular: Negative for chest pain,  palpitations and leg swelling.  Gastrointestinal: Negative for abdominal pain and bowel incontinence.  Genitourinary: Negative for bladder incontinence, dysuria and pelvic pain.  Musculoskeletal: Positive for back pain.  Neurological: Negative for tingling, weakness, numbness, headaches and paresthesias.  All other systems reviewed and are negative.    Physical Exam Updated Vital Signs There were no vitals taken for this visit.  Physical Exam  Constitutional: He is oriented to person, place, and time. He appears well-developed and well-nourished. No distress.  HENT:  Head: Normocephalic and atraumatic.  Nose: Nose normal.  Mouth/Throat: No oropharyngeal exudate.  Eyes: EOM are normal. Pupils are equal, round, and reactive to light.  Neck: Normal range of motion. Neck supple. No JVD present. No tracheal deviation present.  Cardiovascular: Normal rate, regular rhythm and intact distal pulses.   Pulmonary/Chest: Effort normal and breath sounds normal. No stridor. He has no wheezes. He has no rales.  Abdominal: Soft. Bowel sounds are normal. He exhibits no mass. There is no tenderness. There is no rebound and no guarding. No hernia.  Musculoskeletal: Normal range of motion. He exhibits no edema, tenderness or deformity.  Lymphadenopathy:    He has no cervical adenopathy.  Neurological: He is alert and oriented to person, place, and time. He has normal reflexes.  Skin: Skin is warm and dry. Capillary refill takes less than 2 seconds.     ED Treatments / Results  Labs (all labs ordered are listed, but only abnormal results are displayed) Labs Reviewed  URINALYSIS, ROUTINE W REFLEX MICROSCOPIC (NOT AT [facility])    EKG  EKG Interpretation None       Radiology No results found.  Procedures Procedures (including critical care time)  Medications Ordered in ED Medications  ketorolac (TORADOL) injection 60 mg (not administered)  lidocaine (LIDODERM) 5 % 1 patch (not  administered)     Initial Impression / Assessment and Plan / ED Course  There were no vitals filed for this visit. Results for orders placed or performed during the hospital encounter of 07/15/16  Urinalysis, Routine w reflex microscopic (not at [facility])  Result Value Ref Range   Color, Urine YELLOW YELLOW   APPearance CLEAR CLEAR   Specific Gravity, Urine 1.020 1.005 - 1.030   pH 6.0 5.0 - 8.0   Glucose, UA NEGATIVE NEGATIVE mg/dL   Hgb urine dipstick SMALL (A) NEGATIVE   Bilirubin Urine NEGATIVE NEGATIVE   Ketones, ur NEGATIVE NEGATIVE mg/dL   Protein, ur NEGATIVE NEGATIVE mg/dL   Nitrite NEGATIVE NEGATIVE   Leukocytes, UA NEGATIVE NEGATIVE  Urine microscopic-add on  Result Value Ref Range   Squamous Epithelial / LPF NONE SEEN NONE SEEN   WBC, UA NONE SEEN 0 - 5 WBC/hpf   RBC / HPF 0-5 0 - 5 RBC/hpf   Bacteria, UA NONE SEEN NONE SEEN   Dg Lumbar Spine Complete  Result Date: 07/15/2016 CLINICAL  DATA:  70 y/o M; possible pole muscle wall swimming on Tuesday with pain in the mid to lower back. EXAM: LUMBAR SPINE - COMPLETE 4+ VIEW COMPARISON:  None. FINDINGS: Five lumbar type non-rib-bearing vertebral bodies are present. Right upper quadrant surgical clips, presumably cholecystectomy. Hip joints, pubic symphysis, and sacroiliac joints are well maintained. No acute fracture or dislocation is identified. Mild lower lumbar facet arthrosis. IMPRESSION: No acute fracture or dislocation is identified. Mild lumbar spondylosis. Electronically Signed   By: Mitzi Hansen M.D.   On: 07/15/2016 05:08     Final Clinical Impressions(s) / ED Diagnoses   Muscle spasm of the back.  Will prescribe NSAIDs and lidoderm patches follow up with your PMD for recheck .   All questions answered to patient's satisfaction. Based on history and exam patient has been appropriately medically screened and emergency conditions excluded. Patient is stable for discharge at this time. Follow up with your  PMD for recheck in 2 days and strict return precautions given. New Prescriptions New Prescriptions   No medications on file     Dally Oshel, MD 07/15/16 [phone]

## 2016-07-15 NOTE — ED Triage Notes (Signed)
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

## 2016-08-04 DIAGNOSIS — M25571 Pain in right ankle and joints of right foot: Secondary | ICD-10-CM | POA: Diagnosis not present

## 2016-11-30 ENCOUNTER — Ambulatory Visit: Payer: Medicare Other | Admitting: Podiatry

## 2016-12-01 ENCOUNTER — Encounter: Payer: Self-pay | Admitting: Podiatry

## 2016-12-01 ENCOUNTER — Ambulatory Visit (INDEPENDENT_AMBULATORY_CARE_PROVIDER_SITE_OTHER): Payer: PPO | Admitting: Podiatry

## 2016-12-01 VITALS — Ht 67.0 in | Wt 197.0 lb

## 2016-12-01 DIAGNOSIS — M76821 Posterior tibial tendinitis, right leg: Secondary | ICD-10-CM

## 2016-12-01 DIAGNOSIS — M216X2 Other acquired deformities of left foot: Secondary | ICD-10-CM

## 2016-12-01 DIAGNOSIS — B351 Tinea unguium: Secondary | ICD-10-CM | POA: Diagnosis not present

## 2016-12-01 DIAGNOSIS — M79672 Pain in left foot: Secondary | ICD-10-CM

## 2016-12-01 DIAGNOSIS — M79671 Pain in right foot: Secondary | ICD-10-CM

## 2016-12-01 DIAGNOSIS — M76822 Posterior tibial tendinitis, left leg: Secondary | ICD-10-CM

## 2016-12-01 NOTE — Patient Instructions (Signed)
Seen for pain in right foot. Cortisone injection given at right medial ankle area. All nails debrided. Noted of fungal infection in between digits bilateral. Need to keep them dry after each shower. Return in 3 months.

## 2016-12-01 NOTE — Progress Notes (Signed)
Subjective: 71 year old patient presents complaining of pain in both feet. Left foot has gotten better after injection. Right foot pain is off and on at the ankle joint medial aspect. Patient also request toe nails trimmed. They are too thick and problematic.  Objective: Dermatologic: Thick dystrophic nails bilateral. Neurovascular status are within normal. Mild edema left ankle medial aspect. Orthopedic findings show excess sagittal plane motion of the first ray bilateral.  Pronating STJ with medially protruding TNJ complex left foot. Positive of limited dorsiflexion of right ankle joint.   Assessment: Tenosynovitis along the course of Tibialis posterior tendon right > left.  Right ankle Equinus. Hypermobile and elevated first ray bilateral. Subtalar and ankle joint hyperpronation with pain and swelling medial ankle left foot. Onychomycosis  Plan: Reviewed findings and available treatment options, Ritch brace, and possible surgery if conservative treatment fails to improve condition. As per request, course of the right posterior tibialis tendon plantar medial to medial malleoli Injected with mixture of 4 mg Dexamethasone, 4 mg Triamcinolone, and 1 cc of 0.5% Marcaine plain. Patient tolerated well without difficulty.  All nails debrided. Return in 3 month for RFC or sooner if needed.

## 2016-12-23 DIAGNOSIS — I10 Essential (primary) hypertension: Secondary | ICD-10-CM | POA: Diagnosis not present

## 2016-12-23 DIAGNOSIS — E1142 Type 2 diabetes mellitus with diabetic polyneuropathy: Secondary | ICD-10-CM | POA: Diagnosis not present

## 2016-12-23 DIAGNOSIS — N183 Chronic kidney disease, stage 3 (moderate): Secondary | ICD-10-CM | POA: Diagnosis not present

## 2016-12-23 DIAGNOSIS — D6489 Other specified anemias: Secondary | ICD-10-CM | POA: Diagnosis not present

## 2016-12-23 DIAGNOSIS — E7800 Pure hypercholesterolemia, unspecified: Secondary | ICD-10-CM | POA: Diagnosis not present

## 2017-01-12 ENCOUNTER — Other Ambulatory Visit: Payer: Self-pay | Admitting: Podiatry

## 2017-03-09 ENCOUNTER — Encounter: Payer: Self-pay | Admitting: Podiatry

## 2017-03-09 ENCOUNTER — Ambulatory Visit (INDEPENDENT_AMBULATORY_CARE_PROVIDER_SITE_OTHER): Payer: PPO | Admitting: Podiatry

## 2017-03-09 DIAGNOSIS — M79672 Pain in left foot: Secondary | ICD-10-CM

## 2017-03-09 DIAGNOSIS — M79671 Pain in right foot: Secondary | ICD-10-CM | POA: Diagnosis not present

## 2017-03-09 DIAGNOSIS — B351 Tinea unguium: Secondary | ICD-10-CM | POA: Diagnosis not present

## 2017-03-09 NOTE — Patient Instructions (Signed)
Seen for hypertrophic nails. All nails debrided. May benefit from total nail matrixectomy 5th digit right. Return in 3 months or as needed.

## 2017-03-09 NOTE — Progress Notes (Signed)
Subjective: 71 year old patient presents stating that he had minimum pain and have not had flare ups. He feels the socks are helping.   Objective: Dermatologic: Thick dystrophic nails bilateral. 5th digital nail on right foot is severely deformed. Neurovascular status are within normal. Mild edema left ankle medial aspect. Orthopedic findings show excess sagittal plane motion of the first ray bilateral.  Pronating STJ with medially protruding TNJ complex left foot. Positive of limited dorsiflexion of right ankle joint.   Assessment: Improved Tenosynovitis along the course of Tibialis posterior tendon right > left.  Right ankle Equinus. Hypermobile and elevated first ray bilateral. Subtalar and ankle joint hyperpronation with pain and swelling medial ankle left foot. Onychomycosis  Plan: All nails debrided. May benefit from total matrixectomy 5th digit right. Return in 3 month for RFC or sooner if needed.

## 2017-04-27 ENCOUNTER — Encounter: Payer: Self-pay | Admitting: Podiatry

## 2017-04-27 ENCOUNTER — Ambulatory Visit (INDEPENDENT_AMBULATORY_CARE_PROVIDER_SITE_OTHER): Payer: PPO | Admitting: Podiatry

## 2017-04-27 DIAGNOSIS — M10071 Idiopathic gout, right ankle and foot: Secondary | ICD-10-CM | POA: Diagnosis not present

## 2017-04-27 DIAGNOSIS — M25571 Pain in right ankle and joints of right foot: Secondary | ICD-10-CM | POA: Diagnosis not present

## 2017-04-27 NOTE — Patient Instructions (Signed)
Seen for pain in right great toe joint. Cortisone injection given as per request. Return as needed.

## 2017-04-27 NOTE — Progress Notes (Signed)
Subjective: 71 year old patient presents request an injection on right foot over bunion area. It is swollen and painful for the past several days.  Objective: Dermatologic: No abnormal findings. Neurovascular status are within normal. Mild edema left ankle medial aspect. Orthopedic: Inflamed and painful joint first MPJ right foot. Positive of excess sagittal plane motion of the first ray bilateral.  Pronating STJ with medially protruding TNJ complex left foot. Positive of limited dorsiflexion of right ankle joint.   Assessment: Gout right first MPJ. Arthralgia firs MPJ right with pain and swelling Right ankle Equinus. Hypermobile and elevated first ray bilateral.  Plan: Right first MPJ injected with mixture of 4 mg Dexamethasone, 4 mg Triamcinolone, and 1 cc of 0.5% Marcaine plain. Patient tolerated well without difficulty.   Return as needed.

## 2017-06-08 ENCOUNTER — Ambulatory Visit (INDEPENDENT_AMBULATORY_CARE_PROVIDER_SITE_OTHER): Payer: PPO | Admitting: Podiatry

## 2017-06-08 ENCOUNTER — Encounter: Payer: Self-pay | Admitting: Podiatry

## 2017-06-08 ENCOUNTER — Ambulatory Visit: Payer: PPO | Admitting: Podiatry

## 2017-06-08 DIAGNOSIS — B351 Tinea unguium: Secondary | ICD-10-CM

## 2017-06-08 DIAGNOSIS — M216X2 Other acquired deformities of left foot: Secondary | ICD-10-CM

## 2017-06-08 DIAGNOSIS — M21961 Unspecified acquired deformity of right lower leg: Secondary | ICD-10-CM | POA: Diagnosis not present

## 2017-06-08 NOTE — Patient Instructions (Signed)
Seen for hypertrophic nails. All nails debrided. Return in 3 months or as needed.  

## 2017-06-08 NOTE — Progress Notes (Signed)
Subjective: [age]year old patient presents stating that ankle pain is being managed with stretch exercise and with proper shoe gear. No other problem at this time other than thick nails.  Objective: Dermatologic: Thick dystrophic nails x 10. Neurovascular status are within normal. Mild edema left ankle medial aspect. Orthopedic: Positive of excess sagittal plane motion of the first ray bilateral.  Pronating STJ with medially protruding TNJ complex left foot. Positive of limited dorsiflexion of right ankle joint.   Assessment: Positive history of Gout right first MPJ. Arthralgia firs MPJ right with pain and swelling. Right ankle Equinus. Hypermobile and elevated first ray bilateral. Onychomycosis x 10.  Plan: All nails debrided.  Return as needed.

## 2017-06-26 DIAGNOSIS — I10 Essential (primary) hypertension: Secondary | ICD-10-CM | POA: Diagnosis not present

## 2017-06-26 DIAGNOSIS — Z7984 Long term (current) use of oral hypoglycemic drugs: Secondary | ICD-10-CM | POA: Diagnosis not present

## 2017-06-26 DIAGNOSIS — E1142 Type 2 diabetes mellitus with diabetic polyneuropathy: Secondary | ICD-10-CM | POA: Diagnosis not present

## 2017-06-26 DIAGNOSIS — E7800 Pure hypercholesterolemia, unspecified: Secondary | ICD-10-CM | POA: Diagnosis not present

## 2017-06-26 DIAGNOSIS — N183 Chronic kidney disease, stage 3 (moderate): Secondary | ICD-10-CM | POA: Diagnosis not present

## 2017-06-26 DIAGNOSIS — Z23 Encounter for immunization: Secondary | ICD-10-CM | POA: Diagnosis not present

## 2017-06-26 DIAGNOSIS — K219 Gastro-esophageal reflux disease without esophagitis: Secondary | ICD-10-CM | POA: Diagnosis not present

## 2017-08-14 DIAGNOSIS — I10 Essential (primary) hypertension: Secondary | ICD-10-CM | POA: Diagnosis not present

## 2017-11-10 DIAGNOSIS — R509 Fever, unspecified: Secondary | ICD-10-CM | POA: Diagnosis not present

## 2017-11-10 DIAGNOSIS — J101 Influenza due to other identified influenza virus with other respiratory manifestations: Secondary | ICD-10-CM | POA: Diagnosis not present

## 2017-11-10 DIAGNOSIS — R350 Frequency of micturition: Secondary | ICD-10-CM | POA: Diagnosis not present

## 2017-12-25 DIAGNOSIS — Z7984 Long term (current) use of oral hypoglycemic drugs: Secondary | ICD-10-CM | POA: Diagnosis not present

## 2017-12-25 DIAGNOSIS — L219 Seborrheic dermatitis, unspecified: Secondary | ICD-10-CM | POA: Diagnosis not present

## 2017-12-25 DIAGNOSIS — E1142 Type 2 diabetes mellitus with diabetic polyneuropathy: Secondary | ICD-10-CM | POA: Diagnosis not present

## 2017-12-25 DIAGNOSIS — Z125 Encounter for screening for malignant neoplasm of prostate: Secondary | ICD-10-CM | POA: Diagnosis not present

## 2017-12-25 DIAGNOSIS — N183 Chronic kidney disease, stage 3 (moderate): Secondary | ICD-10-CM | POA: Diagnosis not present

## 2017-12-25 DIAGNOSIS — I10 Essential (primary) hypertension: Secondary | ICD-10-CM | POA: Diagnosis not present

## 2017-12-25 DIAGNOSIS — E7800 Pure hypercholesterolemia, unspecified: Secondary | ICD-10-CM | POA: Diagnosis not present

## 2018-01-12 DIAGNOSIS — R946 Abnormal results of thyroid function studies: Secondary | ICD-10-CM | POA: Diagnosis not present

## 2018-07-16 DIAGNOSIS — E7800 Pure hypercholesterolemia, unspecified: Secondary | ICD-10-CM | POA: Diagnosis not present

## 2018-07-16 DIAGNOSIS — E1142 Type 2 diabetes mellitus with diabetic polyneuropathy: Secondary | ICD-10-CM | POA: Diagnosis not present

## 2018-07-16 DIAGNOSIS — Z23 Encounter for immunization: Secondary | ICD-10-CM | POA: Diagnosis not present

## 2018-07-16 DIAGNOSIS — F5101 Primary insomnia: Secondary | ICD-10-CM | POA: Diagnosis not present

## 2018-07-16 DIAGNOSIS — I10 Essential (primary) hypertension: Secondary | ICD-10-CM | POA: Diagnosis not present

## 2018-07-16 DIAGNOSIS — N183 Chronic kidney disease, stage 3 (moderate): Secondary | ICD-10-CM | POA: Diagnosis not present

## 2019-01-15 DIAGNOSIS — E1142 Type 2 diabetes mellitus with diabetic polyneuropathy: Secondary | ICD-10-CM | POA: Diagnosis not present

## 2019-01-15 DIAGNOSIS — E7800 Pure hypercholesterolemia, unspecified: Secondary | ICD-10-CM | POA: Diagnosis not present

## 2019-01-15 DIAGNOSIS — R351 Nocturia: Secondary | ICD-10-CM | POA: Diagnosis not present

## 2019-01-15 DIAGNOSIS — N401 Enlarged prostate with lower urinary tract symptoms: Secondary | ICD-10-CM | POA: Diagnosis not present

## 2019-01-15 DIAGNOSIS — K219 Gastro-esophageal reflux disease without esophagitis: Secondary | ICD-10-CM | POA: Diagnosis not present

## 2019-01-15 DIAGNOSIS — M79644 Pain in right finger(s): Secondary | ICD-10-CM | POA: Diagnosis not present

## 2019-01-15 DIAGNOSIS — N183 Chronic kidney disease, stage 3 (moderate): Secondary | ICD-10-CM | POA: Diagnosis not present

## 2019-01-15 DIAGNOSIS — I10 Essential (primary) hypertension: Secondary | ICD-10-CM | POA: Diagnosis not present

## 2019-03-18 DIAGNOSIS — E7800 Pure hypercholesterolemia, unspecified: Secondary | ICD-10-CM | POA: Diagnosis not present

## 2019-03-18 DIAGNOSIS — Z125 Encounter for screening for malignant neoplasm of prostate: Secondary | ICD-10-CM | POA: Diagnosis not present

## 2019-03-18 DIAGNOSIS — M1009 Idiopathic gout, multiple sites: Secondary | ICD-10-CM | POA: Diagnosis not present

## 2019-03-18 DIAGNOSIS — I10 Essential (primary) hypertension: Secondary | ICD-10-CM | POA: Diagnosis not present

## 2019-03-18 DIAGNOSIS — E1142 Type 2 diabetes mellitus with diabetic polyneuropathy: Secondary | ICD-10-CM | POA: Diagnosis not present

## 2019-03-18 DIAGNOSIS — N183 Chronic kidney disease, stage 3 (moderate): Secondary | ICD-10-CM | POA: Diagnosis not present

## 2019-03-18 DIAGNOSIS — L03113 Cellulitis of right upper limb: Secondary | ICD-10-CM | POA: Diagnosis not present

## 2019-03-18 DIAGNOSIS — N401 Enlarged prostate with lower urinary tract symptoms: Secondary | ICD-10-CM | POA: Diagnosis not present

## 2019-04-04 DIAGNOSIS — R0789 Other chest pain: Secondary | ICD-10-CM | POA: Diagnosis not present

## 2019-04-04 DIAGNOSIS — F419 Anxiety disorder, unspecified: Secondary | ICD-10-CM | POA: Diagnosis not present

## 2019-04-17 DIAGNOSIS — Z7984 Long term (current) use of oral hypoglycemic drugs: Secondary | ICD-10-CM | POA: Diagnosis not present

## 2019-04-17 DIAGNOSIS — I10 Essential (primary) hypertension: Secondary | ICD-10-CM | POA: Diagnosis not present

## 2019-04-17 DIAGNOSIS — E1142 Type 2 diabetes mellitus with diabetic polyneuropathy: Secondary | ICD-10-CM | POA: Diagnosis not present

## 2019-04-17 DIAGNOSIS — N183 Chronic kidney disease, stage 3 (moderate): Secondary | ICD-10-CM | POA: Diagnosis not present

## 2019-04-17 DIAGNOSIS — E7800 Pure hypercholesterolemia, unspecified: Secondary | ICD-10-CM | POA: Diagnosis not present

## 2019-04-17 DIAGNOSIS — N401 Enlarged prostate with lower urinary tract symptoms: Secondary | ICD-10-CM | POA: Diagnosis not present

## 2019-04-17 DIAGNOSIS — E119 Type 2 diabetes mellitus without complications: Secondary | ICD-10-CM | POA: Diagnosis not present

## 2019-04-25 DIAGNOSIS — M2550 Pain in unspecified joint: Secondary | ICD-10-CM | POA: Diagnosis not present

## 2019-04-29 DIAGNOSIS — M2550 Pain in unspecified joint: Secondary | ICD-10-CM | POA: Diagnosis not present

## 2019-05-29 DIAGNOSIS — D72828 Other elevated white blood cell count: Secondary | ICD-10-CM | POA: Diagnosis not present

## 2019-05-29 DIAGNOSIS — M2550 Pain in unspecified joint: Secondary | ICD-10-CM | POA: Diagnosis not present

## 2019-06-20 DIAGNOSIS — Z1159 Encounter for screening for other viral diseases: Secondary | ICD-10-CM | POA: Diagnosis not present

## 2019-06-20 DIAGNOSIS — M109 Gout, unspecified: Secondary | ICD-10-CM | POA: Diagnosis not present

## 2019-07-05 DIAGNOSIS — M1009 Idiopathic gout, multiple sites: Secondary | ICD-10-CM | POA: Diagnosis not present

## 2019-07-18 DIAGNOSIS — I10 Essential (primary) hypertension: Secondary | ICD-10-CM | POA: Diagnosis not present

## 2019-07-18 DIAGNOSIS — N183 Chronic kidney disease, stage 3 unspecified: Secondary | ICD-10-CM | POA: Diagnosis not present

## 2019-07-18 DIAGNOSIS — N401 Enlarged prostate with lower urinary tract symptoms: Secondary | ICD-10-CM | POA: Diagnosis not present

## 2019-07-18 DIAGNOSIS — E7800 Pure hypercholesterolemia, unspecified: Secondary | ICD-10-CM | POA: Diagnosis not present

## 2019-07-18 DIAGNOSIS — E1142 Type 2 diabetes mellitus with diabetic polyneuropathy: Secondary | ICD-10-CM | POA: Diagnosis not present

## 2019-08-26 DIAGNOSIS — F419 Anxiety disorder, unspecified: Secondary | ICD-10-CM | POA: Diagnosis not present

## 2019-08-26 DIAGNOSIS — M79605 Pain in left leg: Secondary | ICD-10-CM | POA: Diagnosis not present

## 2019-08-26 DIAGNOSIS — E7800 Pure hypercholesterolemia, unspecified: Secondary | ICD-10-CM | POA: Diagnosis not present

## 2019-08-26 DIAGNOSIS — M545 Low back pain: Secondary | ICD-10-CM | POA: Diagnosis not present

## 2019-09-03 DIAGNOSIS — M545 Low back pain: Secondary | ICD-10-CM | POA: Diagnosis not present

## 2019-09-03 DIAGNOSIS — M25562 Pain in left knee: Secondary | ICD-10-CM | POA: Diagnosis not present

## 2019-09-03 DIAGNOSIS — M1712 Unilateral primary osteoarthritis, left knee: Secondary | ICD-10-CM | POA: Diagnosis not present

## 2019-09-03 DIAGNOSIS — M25571 Pain in right ankle and joints of right foot: Secondary | ICD-10-CM | POA: Diagnosis not present

## 2019-09-05 DIAGNOSIS — M5416 Radiculopathy, lumbar region: Secondary | ICD-10-CM | POA: Diagnosis not present

## 2019-09-13 DIAGNOSIS — M5416 Radiculopathy, lumbar region: Secondary | ICD-10-CM | POA: Diagnosis not present

## 2019-09-16 DIAGNOSIS — Z1159 Encounter for screening for other viral diseases: Secondary | ICD-10-CM | POA: Diagnosis not present

## 2019-09-25 DIAGNOSIS — M5416 Radiculopathy, lumbar region: Secondary | ICD-10-CM | POA: Diagnosis not present

## 2019-10-01 DIAGNOSIS — M5416 Radiculopathy, lumbar region: Secondary | ICD-10-CM | POA: Diagnosis not present

## 2019-10-03 DIAGNOSIS — M5416 Radiculopathy, lumbar region: Secondary | ICD-10-CM | POA: Diagnosis not present

## 2019-10-11 DIAGNOSIS — M5416 Radiculopathy, lumbar region: Secondary | ICD-10-CM | POA: Diagnosis not present

## 2019-10-11 DIAGNOSIS — M1711 Unilateral primary osteoarthritis, right knee: Secondary | ICD-10-CM | POA: Diagnosis not present

## 2019-10-11 DIAGNOSIS — M79661 Pain in right lower leg: Secondary | ICD-10-CM | POA: Diagnosis not present

## 2019-10-11 DIAGNOSIS — M25461 Effusion, right knee: Secondary | ICD-10-CM | POA: Diagnosis not present

## 2019-10-14 DIAGNOSIS — N183 Chronic kidney disease, stage 3 unspecified: Secondary | ICD-10-CM | POA: Diagnosis not present

## 2019-10-14 DIAGNOSIS — E1142 Type 2 diabetes mellitus with diabetic polyneuropathy: Secondary | ICD-10-CM | POA: Diagnosis not present

## 2019-10-14 DIAGNOSIS — I10 Essential (primary) hypertension: Secondary | ICD-10-CM | POA: Diagnosis not present

## 2019-10-14 DIAGNOSIS — E7800 Pure hypercholesterolemia, unspecified: Secondary | ICD-10-CM | POA: Diagnosis not present

## 2019-10-14 DIAGNOSIS — F419 Anxiety disorder, unspecified: Secondary | ICD-10-CM | POA: Diagnosis not present

## 2019-10-14 DIAGNOSIS — K219 Gastro-esophageal reflux disease without esophagitis: Secondary | ICD-10-CM | POA: Diagnosis not present

## 2019-10-14 DIAGNOSIS — M1009 Idiopathic gout, multiple sites: Secondary | ICD-10-CM | POA: Diagnosis not present

## 2019-10-14 DIAGNOSIS — N401 Enlarged prostate with lower urinary tract symptoms: Secondary | ICD-10-CM | POA: Diagnosis not present

## 2019-10-15 DIAGNOSIS — M5416 Radiculopathy, lumbar region: Secondary | ICD-10-CM | POA: Diagnosis not present

## 2019-10-17 DIAGNOSIS — M5416 Radiculopathy, lumbar region: Secondary | ICD-10-CM | POA: Diagnosis not present

## 2019-10-22 DIAGNOSIS — M5416 Radiculopathy, lumbar region: Secondary | ICD-10-CM | POA: Diagnosis not present

## 2019-10-24 DIAGNOSIS — M5416 Radiculopathy, lumbar region: Secondary | ICD-10-CM | POA: Diagnosis not present

## 2019-10-28 DIAGNOSIS — N183 Chronic kidney disease, stage 3 unspecified: Secondary | ICD-10-CM | POA: Diagnosis not present

## 2019-10-28 DIAGNOSIS — E1142 Type 2 diabetes mellitus with diabetic polyneuropathy: Secondary | ICD-10-CM | POA: Diagnosis not present

## 2019-11-24 ENCOUNTER — Ambulatory Visit: Payer: PPO | Attending: Internal Medicine

## 2019-11-24 DIAGNOSIS — Z23 Encounter for immunization: Secondary | ICD-10-CM

## 2019-11-24 NOTE — Progress Notes (Signed)
   [id number]  Name:  DANIAL HLAVAC Sr.    MRN: [id number] DOB: [birth year]  11/24/2019  Mr. Graveline was observed post Covid-19 immunization for 15 minutes without incidence. He was provided with Vaccine Information Sheet and instruction to access the V-Safe system.   Mr. Doell was instructed to call 911 with any severe reactions post vaccine: Marland Kitchen Difficulty breathing  . Swelling of your face and throat  . A fast heartbeat  . A bad rash all over your body  . Dizziness and weakness    Immunizations Administered    Name Date Dose VIS Date Route   Pfizer COVID-19 Vaccine 11/24/2019  8:47 AM 0.3 mL 09/13/2019 Intramuscular   Manufacturer: [employer], [employer]   Lot: [id number]   NDC: [address]-5

## 2019-11-25 ENCOUNTER — Encounter (HOSPITAL_COMMUNITY): Payer: Self-pay | Admitting: Emergency Medicine

## 2019-11-25 ENCOUNTER — Emergency Department (HOSPITAL_COMMUNITY): Payer: PPO

## 2019-11-25 ENCOUNTER — Emergency Department (HOSPITAL_COMMUNITY)
Admission: EM | Admit: 2019-11-25 | Discharge: 2019-11-25 | Disposition: A | Discharge status: Home / Self Care | Payer: PPO | Attending: Emergency Medicine | Admitting: Emergency Medicine

## 2019-11-25 ENCOUNTER — Other Ambulatory Visit: Payer: Self-pay

## 2019-11-25 DIAGNOSIS — Z87891 Personal history of nicotine dependence: Secondary | ICD-10-CM | POA: Diagnosis not present

## 2019-11-25 DIAGNOSIS — Z79899 Other long term (current) drug therapy: Secondary | ICD-10-CM | POA: Diagnosis not present

## 2019-11-25 DIAGNOSIS — Z7984 Long term (current) use of oral hypoglycemic drugs: Secondary | ICD-10-CM | POA: Diagnosis not present

## 2019-11-25 DIAGNOSIS — E119 Type 2 diabetes mellitus without complications: Secondary | ICD-10-CM | POA: Diagnosis not present

## 2019-11-25 DIAGNOSIS — I10 Essential (primary) hypertension: Secondary | ICD-10-CM | POA: Diagnosis not present

## 2019-11-25 DIAGNOSIS — R519 Headache, unspecified: Secondary | ICD-10-CM | POA: Diagnosis not present

## 2019-11-25 LAB — CBC WITH DIFFERENTIAL/PLATELET
Abs Immature Granulocytes: 0.01 10*3/uL (ref 0.00–0.07)
Basophils Absolute: 0 10*3/uL (ref 0.0–0.1)
Basophils Relative: 0 %
Eosinophils Absolute: 0.1 10*3/uL (ref 0.0–0.5)
Eosinophils Relative: 1 %
HCT: 38.7 % — ABNORMAL LOW (ref 39.0–52.0)
Hemoglobin: 12.3 g/dL — ABNORMAL LOW (ref 13.0–17.0)
Immature Granulocytes: 0 %
Lymphocytes Relative: 17 %
Lymphs Abs: 1.2 10*3/uL (ref 0.7–4.0)
MCH: 28.5 pg (ref 26.0–34.0)
MCHC: 31.8 g/dL (ref 30.0–36.0)
MCV: 89.6 fL (ref 80.0–100.0)
Monocytes Absolute: 0.6 10*3/uL (ref 0.1–1.0)
Monocytes Relative: 8 %
Neutro Abs: 5.3 10*3/uL (ref 1.7–7.7)
Neutrophils Relative %: 74 %
Platelets: 228 10*3/uL (ref 150–400)
RBC: 4.32 MIL/uL (ref 4.22–5.81)
RDW: 15.4 % (ref 11.5–15.5)
WBC: 7.2 10*3/uL (ref 4.0–10.5)
nRBC: 0 % (ref 0.0–0.2)

## 2019-11-25 LAB — BASIC METABOLIC PANEL
Anion gap: 9 (ref 5–15)
BUN: 11 mg/dL (ref 8–23)
CO2: 26 mmol/L (ref 22–32)
Calcium: 9.2 mg/dL (ref 8.9–10.3)
Chloride: 102 mmol/L (ref 98–111)
Creatinine, Ser: 1.2 mg/dL (ref 0.61–1.24)
GFR calc Af Amer: >60 mL/min (ref >60)
GFR calc non Af Amer: 60 mL/min — ABNORMAL LOW (ref >60)
Glucose, Bld: 86 mg/dL (ref 70–99)
Potassium: 4 mmol/L (ref 3.5–5.1)
Sodium: 137 mmol/L (ref 135–145)

## 2019-11-25 LAB — URINALYSIS, ROUTINE W REFLEX MICROSCOPIC
Bilirubin Urine: NEGATIVE
Glucose, UA: NEGATIVE mg/dL
Hgb urine dipstick: NEGATIVE
Ketones, ur: 5 mg/dL — AB
Leukocytes,Ua: NEGATIVE
Nitrite: NEGATIVE
Protein, ur: NEGATIVE mg/dL
Specific Gravity, Urine: 1.011 (ref 1.005–1.030)
pH: 7 (ref 5.0–8.0)

## 2019-11-25 MED ORDER — CARVEDILOL 6.25 MG PO TABS: 6.2500 mg | ORAL_TABLET | Freq: Two times a day (BID) | ORAL | 0 refills | Status: AC

## 2019-11-25 NOTE — ED Notes (Signed)
Labeled urine specimen and culture sent to lab. [facility] 

## 2019-11-25 NOTE — ED Provider Notes (Signed)
[facility]-EMERGENCY DEPT Provider Note   CSN: [id number] Arrival date & time: 11/25/19  1350     History Chief Complaint  Patient presents with   Hypertension    Joel CEDAR Sr. is a 74 y.o. male.  HPI  Patient presents for evaluation of high blood pressure, checked at work.  It was 198/30, 192/24 and 190/112.  He took his morning blood pressure medications, just prior to arrival here.  He states that he was at home this morning when he developed a headache, and it gradually worsened as he drove to work and then began working.  Over the course of about 1/2 hours it became "splitting," at that point his blood pressure was taken as described above.  He states that the headache is much better since he is taking his medications.  It still is present in the right forehead.  He states medication was decreased about a year ago, and he was taken off of HCTZ at that time.  He denies fever, chills, chest pain, shortness of breath, focal weakness or paresthesia.  There are no other known modifying factors.     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  Social History   Tobacco Use   Smoking status: Former Smoker    Types: Cigarettes    Quit date: 10/03/1978    Years since quitting: 41.1   Smokeless tobacco: Never Used  Substance Use Topics   Alcohol use: No     Alcohol/week: 0.0 standard drinks   Drug use: No    Home Medications Prior to Admission medications   Medication Sig Start Date End Date Taking? Authorizing Provider  allopurinol (ZYLOPRIM) 100 MG tablet Take 100 mg by mouth 2 (two) times daily. 10/29/19  Yes [provider]  atorvastatin (LIPITOR) 20 MG tablet Take 20 mg by mouth daily at 6 PM.  03/26/15  Yes [provider]  cholecalciferol (VITAMIN D) 1000 UNITS tablet Take 5,000 Units by mouth daily.    Yes [provider]  Diclofenac Sodium CR (VOLTAREN-XR) 100 MG 24 hr tablet Take 1 tablet (100 mg total) by mouth daily. 07/15/16  Yes Palumbo, April, MD  escitalopram (LEXAPRO) 10 MG tablet Take 10 mg by mouth daily. 11/04/19  Yes [provider]  famotidine (PEPCID) 40 MG tablet Take 40 mg by mouth at bedtime. 10/07/19  Yes [provider]  ibuprofen (ADVIL,MOTRIN) 800 MG tablet TAKE ONE TABLET BY MOUTH EVERY 8 HOURS AS NEEDED Patient taking differently: Take 800 mg by mouth every 8 (eight) hours as needed for moderate pain.  01/12/17  Yes Sheard, Myeong O, DPM  loratadine (CLARITIN) 10 MG tablet Take 10 mg by mouth daily.   Yes [provider]  LORazepam (ATIVAN) 0.5 MG tablet Take 0.25-0.5 mg by mouth 2 (two) times daily as needed for anxiety. 08/26/19  Yes [provider]  metFORMIN (GLUCOPHAGE) 500 MG tablet Take 500 mg by mouth daily with breakfast.  03/16/15  Yes [provider]  carvedilol (COREG) 6.25 MG tablet Take 1 tablet (6.25 mg total) by mouth 2 (two) times daily with a meal. 11/25/19   Daleen Bo, MD  hydrochlorothiazide (HYDRODIURIL) 25 MG tablet Take 1 tablet (25 mg total) by mouth daily. Patient not taking: Reported on 11/25/2019 12/23/12   Rosana Hoes, MD  HYDROcodone-ibuprofen (VICOPROFEN) 7.5-200 MG per tablet Take 1 tablet by mouth every 8 (eight) hours as needed for moderate pain. Patient not taking: Reported on 11/25/2019 04/03/15   Sheard, Myeong O,  DPM  lidocaine (LIDODERM) 5 % Place 1 patch onto the skin daily. Remove & Discard patch within 12 hours or as directed by MD Patient not taking: Reported on 11/25/2019 07/15/16   Palumbo, April, MD  meloxicam (MOBIC) 7.5 MG tablet Take 1 tablet (7.5 mg total) by mouth 2 (two) times daily after a meal. Patient not taking: Reported on 11/25/2019 11/17/14   Billy Fischer, MD  nabumetone (RELAFEN) 500 MG tablet Take 1 tablet (500 mg total) by mouth 2 (two) times daily. Patient not taking: Reported on 11/25/2019 08/28/13   Camelia Phenes, DPM    Allergies    Lisinopril  Review of Systems   Review of Systems  All other systems reviewed and are negative.   Physical Exam Updated Vital Signs BP (!) 166/104    Pulse 86    Temp 98.3 F (36.8 C) (Oral)    Resp 18    Ht 5' 6.5" (1.689 m)    Wt 88.5 kg    SpO2 98%    BMI 31.00 kg/m   Physical Exam Vitals and nursing note reviewed.  Constitutional:      Appearance: He is well-developed.  HENT:     Head: Normocephalic and atraumatic.     Right Ear: External ear normal.     Left Ear: External ear normal.  Eyes:     Conjunctiva/sclera: Conjunctivae normal.     Pupils: Pupils are equal, round, and reactive to light.  Neck:     Trachea: Phonation normal.  Cardiovascular:     Rate and Rhythm: Normal rate and regular rhythm.     Heart sounds: Normal heart sounds.  Pulmonary:     Effort: Pulmonary effort is normal.     Breath sounds: Normal breath sounds.  Abdominal:     General: There is no distension.     Palpations: Abdomen is soft.     Tenderness: There is no abdominal tenderness.  Musculoskeletal:        General: Normal range of motion.     Cervical back: Normal range of motion and neck supple.  Skin:    General: Skin is warm and dry.  Neurological:     Mental Status: He is alert and oriented to person, place, and time.     Cranial Nerves: No cranial nerve deficit.     Sensory: No sensory deficit.     Motor: No abnormal muscle  tone.     Coordination: Coordination normal.     Comments: No dysarthria or aphasia.  Psychiatric:        Mood and Affect: Mood normal.        Behavior: Behavior normal.        Thought Content: Thought content normal.        Judgment: Judgment normal.     ED Results / Procedures /  Treatments   Labs (all labs ordered are listed, but only abnormal results are displayed) Labs Reviewed  BASIC METABOLIC PANEL - Abnormal; Notable for the following components:      Result Value   GFR calc non Af Amer 60 (*)    All other components within normal limits  CBC WITH DIFFERENTIAL/PLATELET - Abnormal; Notable for the following components:   Hemoglobin 12.3 (*)    HCT 38.7 (*)    All other components within normal limits  URINALYSIS, ROUTINE W REFLEX MICROSCOPIC - Abnormal; Notable for the following components:   Ketones, ur 5 (*)    All other components within normal limits    EKG None  Radiology CT Head Wo Contrast  Result Date: 11/25/2019 CLINICAL DATA:  Headache, acute, normal neuro exam. High blood pressure. EXAM: CT HEAD WITHOUT CONTRAST TECHNIQUE: Contiguous axial images were obtained from the base of the skull through the vertex without intravenous contrast. COMPARISON:  Head CT 08/09/2004 FINDINGS: Brain: No intracranial hemorrhage, mass effect, or midline shift. No hydrocephalus. The basilar cisterns are patent. Mild periventricular and deep white matter hypodensity most consistent with chronic small vessel ischemia. No evidence of territorial infarct or acute ischemia. No extra-axial or intracranial fluid collection. Vascular: No hyperdense vessel. Skull: No fracture or focal lesion. Sinuses/Orbits: Paranasal sinuses and mastoid air cells are clear. The visualized orbits are unremarkable. Other: None. IMPRESSION: No acute intracranial abnormality. Mild chronic small vessel ischemia. Electronically Signed   By: Narda Rutherford M.D.   On: 11/25/2019 17:34    Procedures Procedures  (including critical care time)  Medications Ordered in ED Medications - No data to display  ED Course  I have reviewed the triage vital signs and the nursing notes.  Pertinent labs & imaging results that were available during my care of the patient were reviewed by me and considered in my medical decision making (see chart for details).  Clinical Course as of Nov 24 1830  Mon Nov 25, 2019  1826 Normal   Basic metabolic panel(!) [EW]  1826 Normal except hemoglobin slightly low  CBC with Differential(!) [EW]  1826 Normal except small amount of ketones present  Urinalysis, Routine w reflex microscopic(!) [EW]  1829 No acute abnormality, interpreted by radiology  CT Head Wo Contrast [EW]    Clinical Course User Index [EW] Mancel Bale, MD   MDM Rules/Calculators/A&P                       Patient Vitals for the past 24 hrs:  BP Temp Temp src Pulse Resp SpO2 Height Weight  11/25/19 1730 (!) 166/104 -- -- 86 -- 98 % -- --  11/25/19 1630 (!) 170/96 -- -- 84 -- 100 % -- --  11/25/19 1608 -- -- -- 85 -- 99 % -- --  11/25/19 1607 (!) 176/119 -- -- 86 18 99 % -- --  11/25/19 1359 -- -- -- -- -- -- 5' 6.5" (1.689 m) 88.5 kg  11/25/19 1358 (!) 175/104 98.3 F (36.8 C) Oral 92 18 99 % -- --    6:26 PM Reevaluation with update and discussion. After initial assessment and treatment, an updated evaluation reveals no change in clinical status, findings discussed and questions answered. Mancel Bale   Medical Decision Making: High blood pressure, and medication changes a year ago, presenting now with elevated blood pressure and headache.  Doubt intracranial bleeding, CVA or hypertensive urgency.  Joel Cedar Sr. was evaluated in Emergency Department on 11/25/2019  for the symptoms described in the history of present illness. He was evaluated in the context of the global COVID-19 pandemic, which necessitated consideration that the patient might be at risk for infection with the SARS-CoV-2  virus that causes COVID-19. Institutional protocols and algorithms that pertain to the evaluation of patients at risk for COVID-19 are in a state of rapid change based on information released by regulatory bodies including the CDC and federal and state organizations. These policies and algorithms were followed during the patient's care in the ED.    CRITICAL CARE- No Performed by: Mancel Bale   Nursing Notes Reviewed/ Care Coordinated Applicable Imaging Reviewed Interpretation of Laboratory Data incorporated into ED treatment  The patient appears reasonably screened and/or stabilized for discharge and I doubt any other medical condition or other [facility] requiring further screening, evaluation, or treatment in the ED at this time prior to discharge.  Plan: Home Medications-increase carvedilol, to 6.25, twice daily, continue others; Home Treatments-low-salt diet; return here if the recommended treatment, does not improve the symptoms; Recommended follow up-PCP, checkup 1 week.    Final Clinical Impression(s) / ED Diagnoses Final diagnoses:  Hypertension, unspecified type    Rx / DC Orders ED Discharge Orders         Ordered    carvedilol (COREG) 6.25 MG tablet  2 times daily with meals     11/25/19 1831           Mancel Bale, MD 11/25/19 [phone]

## 2019-11-25 NOTE — ED Triage Notes (Signed)
Pt reports that he was brought in bc his BP was high 198/130, 192/124, and 190/112. Reports didn't take his HTN meds until right before he came here. Reports having headache since this morning.

## 2019-11-25 NOTE — Discharge Instructions (Addendum)
We are increasing your carvedilol to 6.25 mg, twice a day.  Make sure you stay on a low-sodium diet.

## 2019-11-26 DIAGNOSIS — J0100 Acute maxillary sinusitis, unspecified: Secondary | ICD-10-CM | POA: Diagnosis not present

## 2019-11-26 DIAGNOSIS — I10 Essential (primary) hypertension: Secondary | ICD-10-CM | POA: Diagnosis not present

## 2019-12-18 ENCOUNTER — Ambulatory Visit: Payer: PPO | Attending: Internal Medicine

## 2019-12-18 DIAGNOSIS — Z23 Encounter for immunization: Secondary | ICD-10-CM

## 2019-12-18 NOTE — Progress Notes (Signed)
   [id number]  Name:  Joel Hall Sr.    MRN: [id number] DOB: Aug 25, 1946  12/18/2019  [name] was observed post Covid-19 immunization for 15 minutes without incident. He was provided with Vaccine Information Sheet and instruction to access the V-Safe system.   [name] was instructed to call 911 with any severe reactions post vaccine: Marland Kitchen Difficulty breathing  . Swelling of face and throat  . A fast heartbeat  . A bad rash all over body  . Dizziness and weakness   Immunizations Administered    Name Date Dose VIS Date Route   Pfizer COVID-19 Vaccine 12/18/2019 10:52 AM 0.3 mL 09/13/2019 Intramuscular   Manufacturer: [employer], [employer]   Lot: [id number]   NDC: [address]-6

## 2019-12-19 DIAGNOSIS — M1711 Unilateral primary osteoarthritis, right knee: Secondary | ICD-10-CM | POA: Diagnosis not present

## 2020-01-14 DIAGNOSIS — M25571 Pain in right ankle and joints of right foot: Secondary | ICD-10-CM | POA: Diagnosis not present

## 2020-01-14 DIAGNOSIS — M19071 Primary osteoarthritis, right ankle and foot: Secondary | ICD-10-CM | POA: Diagnosis not present

## 2020-01-14 DIAGNOSIS — M19072 Primary osteoarthritis, left ankle and foot: Secondary | ICD-10-CM | POA: Diagnosis not present

## 2020-02-05 DIAGNOSIS — M1712 Unilateral primary osteoarthritis, left knee: Secondary | ICD-10-CM | POA: Diagnosis not present

## 2020-02-05 DIAGNOSIS — M25461 Effusion, right knee: Secondary | ICD-10-CM | POA: Diagnosis not present

## 2020-02-05 DIAGNOSIS — M1711 Unilateral primary osteoarthritis, right knee: Secondary | ICD-10-CM | POA: Diagnosis not present

## 2020-02-12 DIAGNOSIS — M1711 Unilateral primary osteoarthritis, right knee: Secondary | ICD-10-CM | POA: Diagnosis not present

## 2020-02-12 DIAGNOSIS — M1712 Unilateral primary osteoarthritis, left knee: Secondary | ICD-10-CM | POA: Diagnosis not present

## 2020-02-19 DIAGNOSIS — M1712 Unilateral primary osteoarthritis, left knee: Secondary | ICD-10-CM | POA: Diagnosis not present

## 2020-02-19 DIAGNOSIS — M1711 Unilateral primary osteoarthritis, right knee: Secondary | ICD-10-CM | POA: Diagnosis not present

## 2020-05-20 DIAGNOSIS — Z1159 Encounter for screening for other viral diseases: Secondary | ICD-10-CM | POA: Diagnosis not present

## 2020-07-22 IMAGING — CT CT HEAD W/O CM
3 series · 14 of 47 positions shown, 16 images · non-contrast
Comparison: Head CT 08/09/2004

CLINICAL DATA: Headache, acute, normal neuro exam. High blood
pressure.

EXAM:
CT HEAD WITHOUT CONTRAST
TECHNIQUE: Contiguous axial images were obtained from the base of the skull
through the vertex without intravenous contrast.

[Series 2: head wo · axial · 0.47mm/px · z∈[-98,+27]mm · 8 of 30 slices shown, 10 images]
[im 3/30  brain]
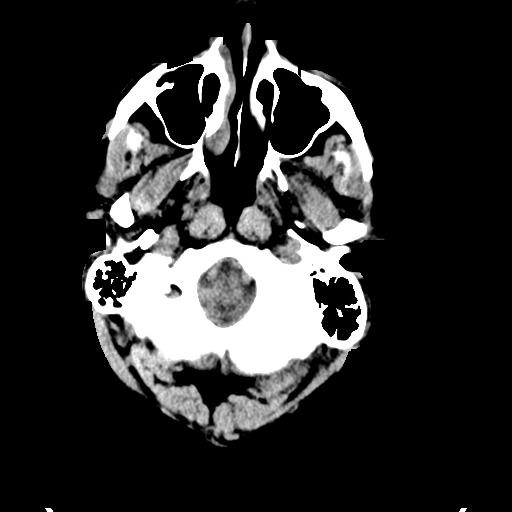
[im 3/30  bone]
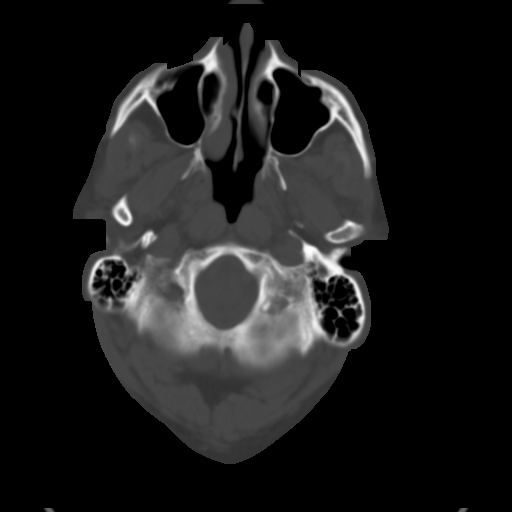
[im 7/30  brain]
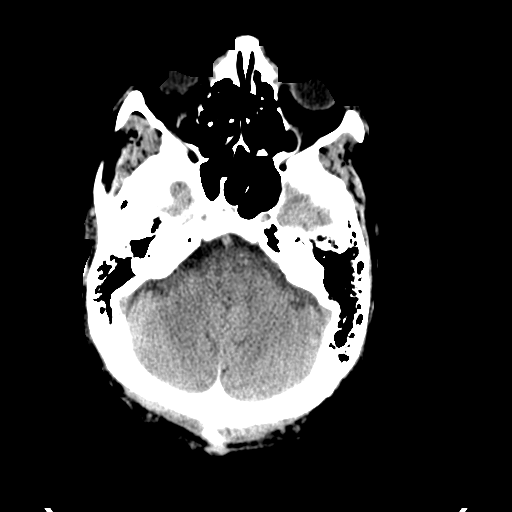
[im 10/30  brain]
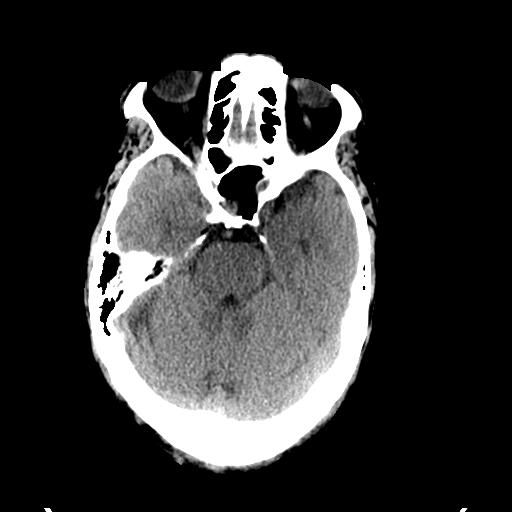
[im 14/30  brain]
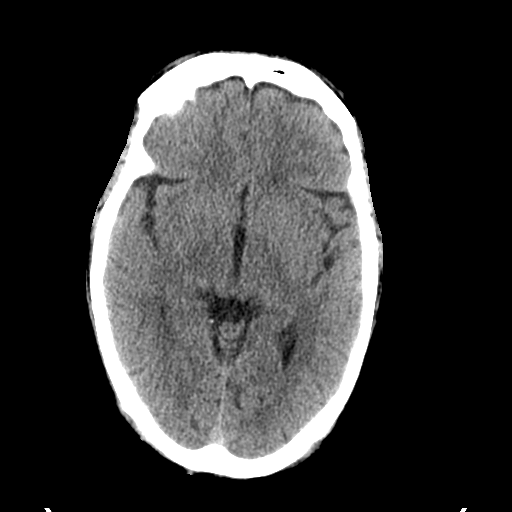
[im 17/30  brain]
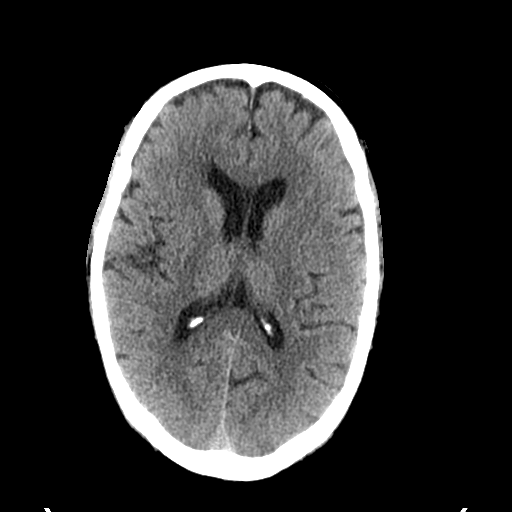
[im 17/30  bone]
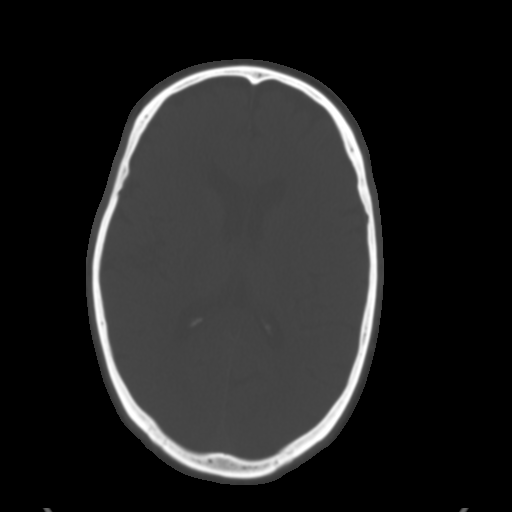
[im 21/30  brain]
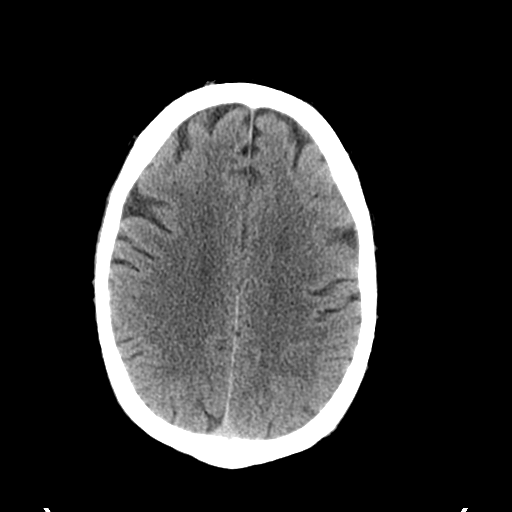
[im 24/30  brain]
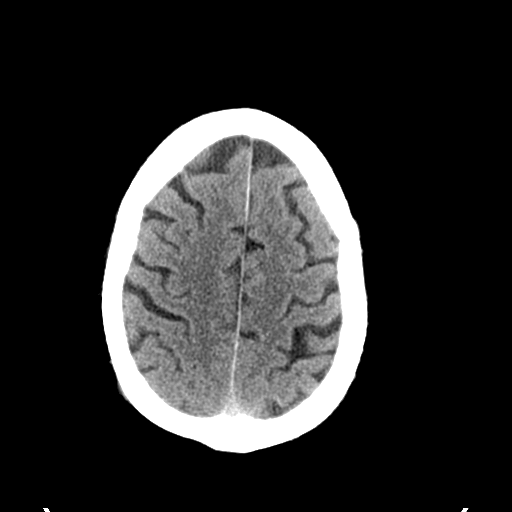
[im 28/30  brain]
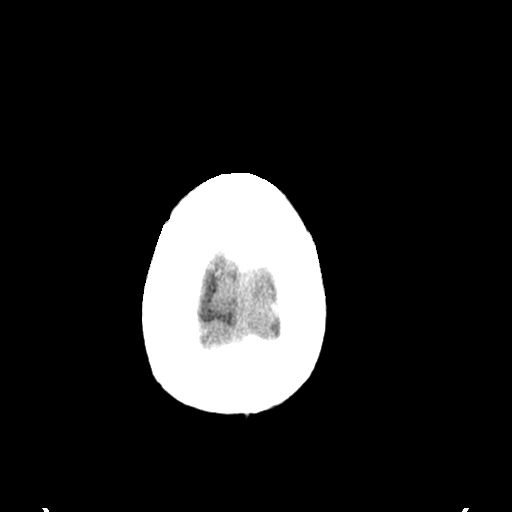

[Series 4: coronal soft tissue · coronal · 0.35mm/px · 3 of 69 slices shown]
[im 23/69  brain]
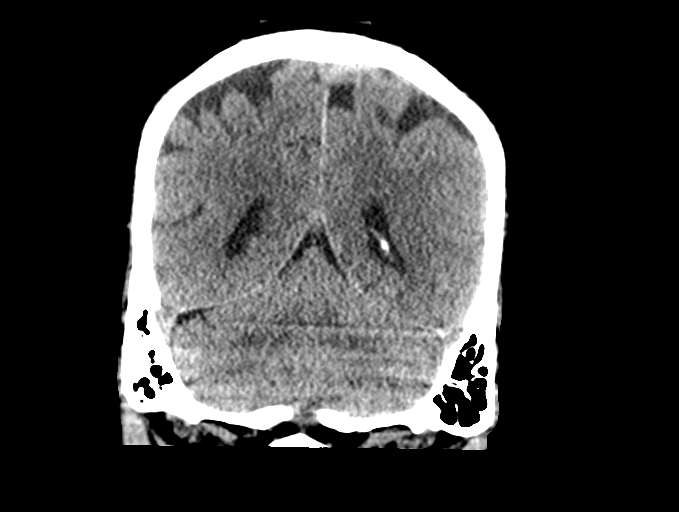
[im 31/69  brain]
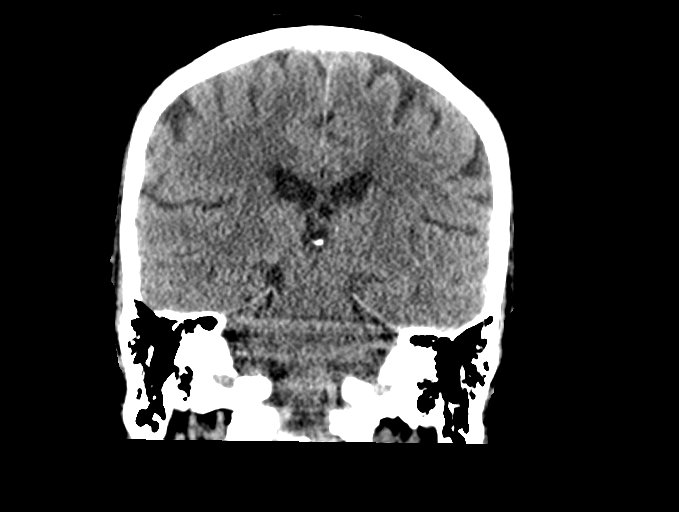
[im 38/69  brain]
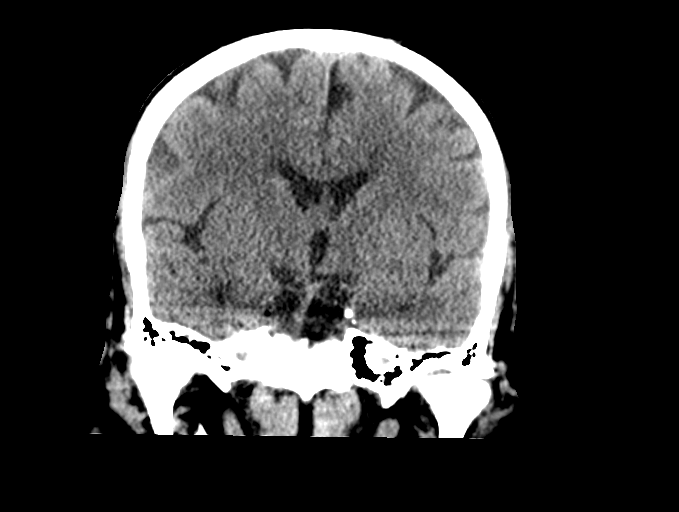

[Series 5: sagittal soft tissue · sagittal · 0.37mm/px · 3 of 49 slices shown]
[im 17/49  brain]
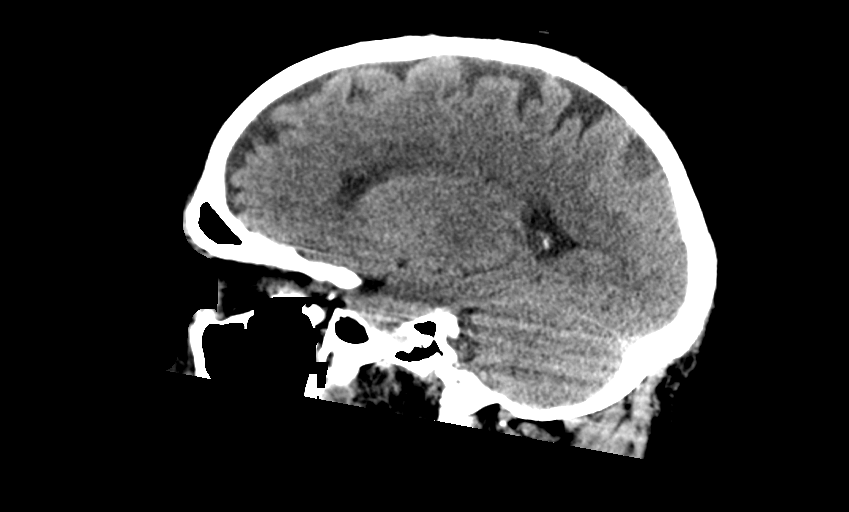
[im 25/49  brain]
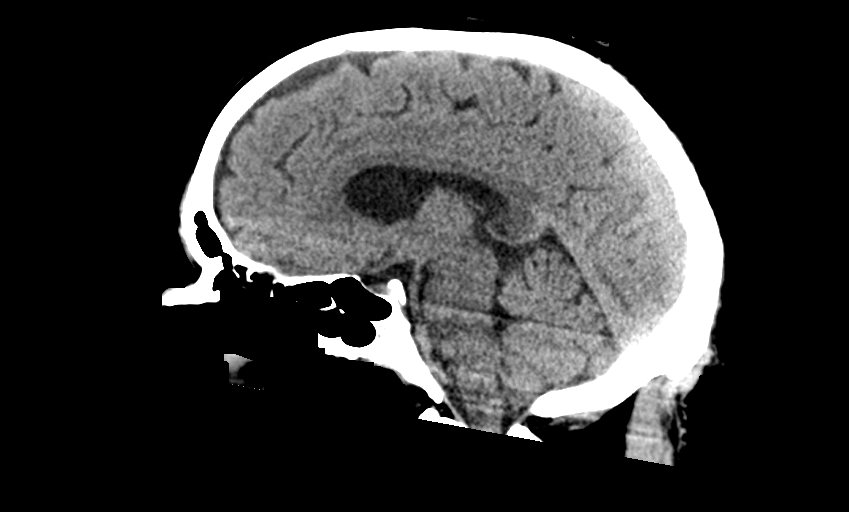
[im 33/49  brain]
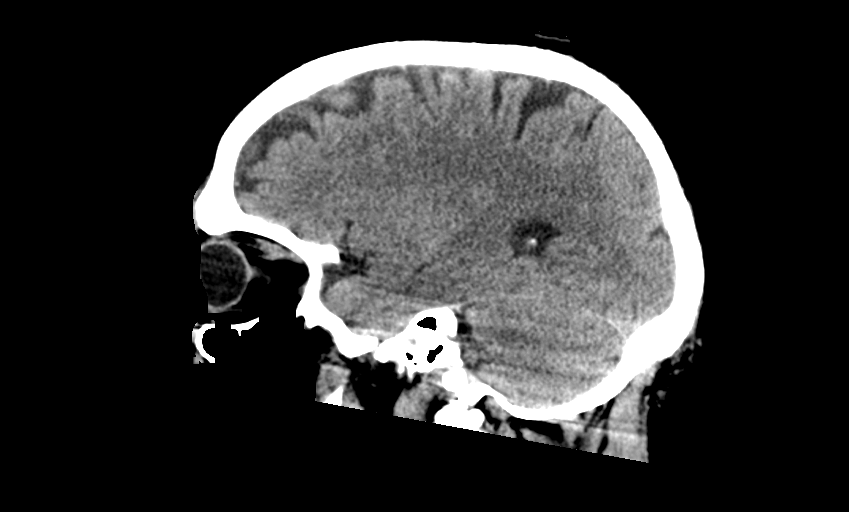

[14 of 47 positions shown; findings below may reference images not displayed]

FINDINGS: Brain: No intracranial hemorrhage, mass effect, or midline shift. No
hydrocephalus. The basilar cisterns are patent. Mild periventricular
and deep white matter hypodensity most consistent with chronic small
vessel ischemia. No evidence of territorial infarct or acute
ischemia. No extra-axial or intracranial fluid collection.

Vascular: No hyperdense vessel.

Skull: No fracture or focal lesion.

Sinuses/Orbits: Paranasal sinuses and mastoid air cells are clear.
The visualized orbits are unremarkable.

Other: None.
IMPRESSION: No acute intracranial abnormality. Mild chronic small vessel
ischemia.

## 2020-08-20 DIAGNOSIS — E7800 Pure hypercholesterolemia, unspecified: Secondary | ICD-10-CM | POA: Diagnosis not present

## 2020-08-20 DIAGNOSIS — I10 Essential (primary) hypertension: Secondary | ICD-10-CM | POA: Diagnosis not present

## 2020-09-02 DIAGNOSIS — M25461 Effusion, right knee: Secondary | ICD-10-CM | POA: Diagnosis not present

## 2020-09-02 DIAGNOSIS — M1711 Unilateral primary osteoarthritis, right knee: Secondary | ICD-10-CM | POA: Diagnosis not present

## 2021-03-29 DIAGNOSIS — E1142 Type 2 diabetes mellitus with diabetic polyneuropathy: Secondary | ICD-10-CM | POA: Diagnosis not present

## 2021-03-29 DIAGNOSIS — E7800 Pure hypercholesterolemia, unspecified: Secondary | ICD-10-CM | POA: Diagnosis not present

## 2021-03-29 DIAGNOSIS — I10 Essential (primary) hypertension: Secondary | ICD-10-CM | POA: Diagnosis not present

## 2021-04-08 DIAGNOSIS — Z1159 Encounter for screening for other viral diseases: Secondary | ICD-10-CM | POA: Diagnosis not present

## 2021-04-20 DIAGNOSIS — Z1159 Encounter for screening for other viral diseases: Secondary | ICD-10-CM | POA: Diagnosis not present

## 2021-08-30 DIAGNOSIS — Z1159 Encounter for screening for other viral diseases: Secondary | ICD-10-CM | POA: Diagnosis not present

## 2021-09-16 DIAGNOSIS — J0110 Acute frontal sinusitis, unspecified: Secondary | ICD-10-CM | POA: Diagnosis not present

## 2021-10-12 DIAGNOSIS — K219 Gastro-esophageal reflux disease without esophagitis: Secondary | ICD-10-CM | POA: Diagnosis not present

## 2021-10-12 DIAGNOSIS — M545 Low back pain, unspecified: Secondary | ICD-10-CM | POA: Diagnosis not present

## 2021-10-12 DIAGNOSIS — J0101 Acute recurrent maxillary sinusitis: Secondary | ICD-10-CM | POA: Diagnosis not present

## 2021-10-12 DIAGNOSIS — E1142 Type 2 diabetes mellitus with diabetic polyneuropathy: Secondary | ICD-10-CM | POA: Diagnosis not present

## 2021-10-12 DIAGNOSIS — I10 Essential (primary) hypertension: Secondary | ICD-10-CM | POA: Diagnosis not present

## 2021-10-12 DIAGNOSIS — E7800 Pure hypercholesterolemia, unspecified: Secondary | ICD-10-CM | POA: Diagnosis not present

## 2021-10-12 DIAGNOSIS — M1009 Idiopathic gout, multiple sites: Secondary | ICD-10-CM | POA: Diagnosis not present

## 2021-10-12 DIAGNOSIS — N183 Chronic kidney disease, stage 3 unspecified: Secondary | ICD-10-CM | POA: Diagnosis not present

## 2022-01-03 DIAGNOSIS — N400 Enlarged prostate without lower urinary tract symptoms: Secondary | ICD-10-CM | POA: Diagnosis not present

## 2022-01-03 DIAGNOSIS — E1122 Type 2 diabetes mellitus with diabetic chronic kidney disease: Secondary | ICD-10-CM | POA: Diagnosis not present

## 2022-01-03 DIAGNOSIS — K219 Gastro-esophageal reflux disease without esophagitis: Secondary | ICD-10-CM | POA: Diagnosis not present

## 2022-01-03 DIAGNOSIS — E1169 Type 2 diabetes mellitus with other specified complication: Secondary | ICD-10-CM | POA: Diagnosis not present

## 2022-01-03 DIAGNOSIS — E785 Hyperlipidemia, unspecified: Secondary | ICD-10-CM | POA: Diagnosis not present

## 2022-01-03 DIAGNOSIS — M1990 Unspecified osteoarthritis, unspecified site: Secondary | ICD-10-CM | POA: Diagnosis not present

## 2022-01-03 DIAGNOSIS — D509 Iron deficiency anemia, unspecified: Secondary | ICD-10-CM | POA: Diagnosis not present

## 2022-01-03 DIAGNOSIS — E669 Obesity, unspecified: Secondary | ICD-10-CM | POA: Diagnosis not present

## 2022-01-03 DIAGNOSIS — N183 Chronic kidney disease, stage 3 unspecified: Secondary | ICD-10-CM | POA: Diagnosis not present

## 2022-01-03 DIAGNOSIS — I10 Essential (primary) hypertension: Secondary | ICD-10-CM | POA: Diagnosis not present

## 2022-01-03 DIAGNOSIS — Z87891 Personal history of nicotine dependence: Secondary | ICD-10-CM | POA: Diagnosis not present

## 2022-01-03 DIAGNOSIS — E1142 Type 2 diabetes mellitus with diabetic polyneuropathy: Secondary | ICD-10-CM | POA: Diagnosis not present

## 2022-04-11 DIAGNOSIS — N401 Enlarged prostate with lower urinary tract symptoms: Secondary | ICD-10-CM | POA: Diagnosis not present

## 2022-04-11 DIAGNOSIS — M1009 Idiopathic gout, multiple sites: Secondary | ICD-10-CM | POA: Diagnosis not present

## 2022-04-11 DIAGNOSIS — N183 Chronic kidney disease, stage 3 unspecified: Secondary | ICD-10-CM | POA: Diagnosis not present

## 2022-04-11 DIAGNOSIS — I10 Essential (primary) hypertension: Secondary | ICD-10-CM | POA: Diagnosis not present

## 2022-04-11 DIAGNOSIS — L739 Follicular disorder, unspecified: Secondary | ICD-10-CM | POA: Diagnosis not present

## 2022-04-11 DIAGNOSIS — E1142 Type 2 diabetes mellitus with diabetic polyneuropathy: Secondary | ICD-10-CM | POA: Diagnosis not present

## 2022-04-11 DIAGNOSIS — E7800 Pure hypercholesterolemia, unspecified: Secondary | ICD-10-CM | POA: Diagnosis not present

## 2022-11-10 DIAGNOSIS — I10 Essential (primary) hypertension: Secondary | ICD-10-CM | POA: Diagnosis not present

## 2022-11-10 DIAGNOSIS — E1122 Type 2 diabetes mellitus with diabetic chronic kidney disease: Secondary | ICD-10-CM | POA: Diagnosis not present

## 2022-11-10 DIAGNOSIS — E7800 Pure hypercholesterolemia, unspecified: Secondary | ICD-10-CM | POA: Diagnosis not present

## 2022-11-10 DIAGNOSIS — Z0000 Encounter for general adult medical examination without abnormal findings: Secondary | ICD-10-CM | POA: Diagnosis not present

## 2022-11-10 DIAGNOSIS — Z8739 Personal history of other diseases of the musculoskeletal system and connective tissue: Secondary | ICD-10-CM | POA: Diagnosis not present

## 2022-11-10 DIAGNOSIS — N183 Chronic kidney disease, stage 3 unspecified: Secondary | ICD-10-CM | POA: Diagnosis not present

## 2022-11-10 DIAGNOSIS — E1142 Type 2 diabetes mellitus with diabetic polyneuropathy: Secondary | ICD-10-CM | POA: Diagnosis not present

## 2022-11-10 DIAGNOSIS — N401 Enlarged prostate with lower urinary tract symptoms: Secondary | ICD-10-CM | POA: Diagnosis not present

## 2023-05-10 DIAGNOSIS — I10 Essential (primary) hypertension: Secondary | ICD-10-CM | POA: Diagnosis not present

## 2023-05-10 DIAGNOSIS — E7800 Pure hypercholesterolemia, unspecified: Secondary | ICD-10-CM | POA: Diagnosis not present

## 2023-05-10 DIAGNOSIS — K219 Gastro-esophageal reflux disease without esophagitis: Secondary | ICD-10-CM | POA: Diagnosis not present

## 2023-05-10 DIAGNOSIS — N401 Enlarged prostate with lower urinary tract symptoms: Secondary | ICD-10-CM | POA: Diagnosis not present

## 2023-05-10 DIAGNOSIS — E1122 Type 2 diabetes mellitus with diabetic chronic kidney disease: Secondary | ICD-10-CM | POA: Diagnosis not present

## 2023-05-10 DIAGNOSIS — E1142 Type 2 diabetes mellitus with diabetic polyneuropathy: Secondary | ICD-10-CM | POA: Diagnosis not present

## 2023-05-10 DIAGNOSIS — N183 Chronic kidney disease, stage 3 unspecified: Secondary | ICD-10-CM | POA: Diagnosis not present

## 2023-05-10 DIAGNOSIS — Z8739 Personal history of other diseases of the musculoskeletal system and connective tissue: Secondary | ICD-10-CM | POA: Diagnosis not present

## 2024-03-02 DIAGNOSIS — N183 Chronic kidney disease, stage 3 unspecified: Secondary | ICD-10-CM | POA: Diagnosis not present

## 2024-03-02 DIAGNOSIS — I10 Essential (primary) hypertension: Secondary | ICD-10-CM | POA: Diagnosis not present

## 2024-03-02 DIAGNOSIS — E1142 Type 2 diabetes mellitus with diabetic polyneuropathy: Secondary | ICD-10-CM | POA: Diagnosis not present

## 2024-03-02 DIAGNOSIS — E7800 Pure hypercholesterolemia, unspecified: Secondary | ICD-10-CM | POA: Diagnosis not present

## 2024-04-01 DIAGNOSIS — E7800 Pure hypercholesterolemia, unspecified: Secondary | ICD-10-CM | POA: Diagnosis not present

## 2024-04-01 DIAGNOSIS — N183 Chronic kidney disease, stage 3 unspecified: Secondary | ICD-10-CM | POA: Diagnosis not present

## 2024-04-01 DIAGNOSIS — E1142 Type 2 diabetes mellitus with diabetic polyneuropathy: Secondary | ICD-10-CM | POA: Diagnosis not present

## 2024-04-01 DIAGNOSIS — I10 Essential (primary) hypertension: Secondary | ICD-10-CM | POA: Diagnosis not present

## 2024-05-02 DIAGNOSIS — E7800 Pure hypercholesterolemia, unspecified: Secondary | ICD-10-CM | POA: Diagnosis not present

## 2024-05-02 DIAGNOSIS — I10 Essential (primary) hypertension: Secondary | ICD-10-CM | POA: Diagnosis not present

## 2024-05-02 DIAGNOSIS — E1142 Type 2 diabetes mellitus with diabetic polyneuropathy: Secondary | ICD-10-CM | POA: Diagnosis not present

## 2024-05-02 DIAGNOSIS — N183 Chronic kidney disease, stage 3 unspecified: Secondary | ICD-10-CM | POA: Diagnosis not present

## 2024-06-02 DIAGNOSIS — N183 Chronic kidney disease, stage 3 unspecified: Secondary | ICD-10-CM | POA: Diagnosis not present

## 2024-06-02 DIAGNOSIS — I10 Essential (primary) hypertension: Secondary | ICD-10-CM | POA: Diagnosis not present

## 2024-06-02 DIAGNOSIS — E1142 Type 2 diabetes mellitus with diabetic polyneuropathy: Secondary | ICD-10-CM | POA: Diagnosis not present

## 2024-06-02 DIAGNOSIS — E7800 Pure hypercholesterolemia, unspecified: Secondary | ICD-10-CM | POA: Diagnosis not present

## 2024-06-05 DIAGNOSIS — N401 Enlarged prostate with lower urinary tract symptoms: Secondary | ICD-10-CM | POA: Diagnosis not present

## 2024-06-05 DIAGNOSIS — K219 Gastro-esophageal reflux disease without esophagitis: Secondary | ICD-10-CM | POA: Diagnosis not present

## 2024-06-05 DIAGNOSIS — R197 Diarrhea, unspecified: Secondary | ICD-10-CM | POA: Diagnosis not present

## 2024-06-05 DIAGNOSIS — I10 Essential (primary) hypertension: Secondary | ICD-10-CM | POA: Diagnosis not present

## 2024-06-05 DIAGNOSIS — Z23 Encounter for immunization: Secondary | ICD-10-CM | POA: Diagnosis not present

## 2024-06-05 DIAGNOSIS — Z125 Encounter for screening for malignant neoplasm of prostate: Secondary | ICD-10-CM | POA: Diagnosis not present

## 2024-06-05 DIAGNOSIS — R4189 Other symptoms and signs involving cognitive functions and awareness: Secondary | ICD-10-CM | POA: Diagnosis not present

## 2024-06-05 DIAGNOSIS — E1142 Type 2 diabetes mellitus with diabetic polyneuropathy: Secondary | ICD-10-CM | POA: Diagnosis not present

## 2024-06-05 DIAGNOSIS — E7800 Pure hypercholesterolemia, unspecified: Secondary | ICD-10-CM | POA: Diagnosis not present

## 2024-06-05 DIAGNOSIS — N183 Chronic kidney disease, stage 3 unspecified: Secondary | ICD-10-CM | POA: Diagnosis not present

## 2024-06-05 DIAGNOSIS — Z8739 Personal history of other diseases of the musculoskeletal system and connective tissue: Secondary | ICD-10-CM | POA: Diagnosis not present

## 2024-06-05 DIAGNOSIS — Z0000 Encounter for general adult medical examination without abnormal findings: Secondary | ICD-10-CM | POA: Diagnosis not present

## 2024-06-25 DIAGNOSIS — Z1211 Encounter for screening for malignant neoplasm of colon: Secondary | ICD-10-CM | POA: Diagnosis not present

## 2024-06-25 DIAGNOSIS — R197 Diarrhea, unspecified: Secondary | ICD-10-CM | POA: Diagnosis not present

## 2024-06-27 DIAGNOSIS — R197 Diarrhea, unspecified: Secondary | ICD-10-CM | POA: Diagnosis not present

## 2024-07-02 DIAGNOSIS — E7800 Pure hypercholesterolemia, unspecified: Secondary | ICD-10-CM | POA: Diagnosis not present

## 2024-07-02 DIAGNOSIS — E1142 Type 2 diabetes mellitus with diabetic polyneuropathy: Secondary | ICD-10-CM | POA: Diagnosis not present

## 2024-07-02 DIAGNOSIS — I10 Essential (primary) hypertension: Secondary | ICD-10-CM | POA: Diagnosis not present

## 2024-07-02 DIAGNOSIS — N183 Chronic kidney disease, stage 3 unspecified: Secondary | ICD-10-CM | POA: Diagnosis not present

## 2024-07-16 DIAGNOSIS — R197 Diarrhea, unspecified: Secondary | ICD-10-CM | POA: Diagnosis not present

## 2024-07-16 DIAGNOSIS — K5730 Diverticulosis of large intestine without perforation or abscess without bleeding: Secondary | ICD-10-CM | POA: Diagnosis not present

## 2024-07-16 DIAGNOSIS — K648 Other hemorrhoids: Secondary | ICD-10-CM | POA: Diagnosis not present

## 2024-07-18 DIAGNOSIS — R197 Diarrhea, unspecified: Secondary | ICD-10-CM | POA: Diagnosis not present

## 2024-08-02 DIAGNOSIS — I10 Essential (primary) hypertension: Secondary | ICD-10-CM | POA: Diagnosis not present

## 2024-08-02 DIAGNOSIS — E7800 Pure hypercholesterolemia, unspecified: Secondary | ICD-10-CM | POA: Diagnosis not present

## 2024-08-02 DIAGNOSIS — N183 Chronic kidney disease, stage 3 unspecified: Secondary | ICD-10-CM | POA: Diagnosis not present

## 2024-08-02 DIAGNOSIS — E1142 Type 2 diabetes mellitus with diabetic polyneuropathy: Secondary | ICD-10-CM | POA: Diagnosis not present

## 2024-09-01 DIAGNOSIS — I10 Essential (primary) hypertension: Secondary | ICD-10-CM | POA: Diagnosis not present

## 2024-09-01 DIAGNOSIS — E7800 Pure hypercholesterolemia, unspecified: Secondary | ICD-10-CM | POA: Diagnosis not present

## 2024-09-01 DIAGNOSIS — N183 Chronic kidney disease, stage 3 unspecified: Secondary | ICD-10-CM | POA: Diagnosis not present

## 2024-09-01 DIAGNOSIS — E1142 Type 2 diabetes mellitus with diabetic polyneuropathy: Secondary | ICD-10-CM | POA: Diagnosis not present
